# Patient Record
Sex: Female | Born: 1935 | Race: White | Hispanic: No | Smoking: Never smoker
Health system: Southern US, Community
[De-identification: ages and names within clinical notes are randomized; demographics above are authoritative.]

## PROBLEM LIST (undated history)

## (undated) DIAGNOSIS — C801 Malignant (primary) neoplasm, unspecified: Secondary | ICD-10-CM

## (undated) DIAGNOSIS — F0390 Unspecified dementia without behavioral disturbance: Secondary | ICD-10-CM

## (undated) HISTORY — PX: OTHER SURGICAL HISTORY: SHX169

## (undated) HISTORY — DX: Unspecified dementia, unspecified severity, without behavioral disturbance, psychotic disturbance, mood disturbance, and anxiety: F03.90

---

## 2020-05-31 ENCOUNTER — Emergency Department (HOSPITAL_COMMUNITY)
Admission: EM | Admit: 2020-05-31 | Discharge: 2020-06-01 | Disposition: A | Discharge status: Home / Self Care | Payer: Medicare Other | Attending: Emergency Medicine | Admitting: Emergency Medicine

## 2020-05-31 ENCOUNTER — Encounter (HOSPITAL_COMMUNITY): Payer: Self-pay

## 2020-05-31 DIAGNOSIS — S22080A Wedge compression fracture of T11-T12 vertebra, initial encounter for closed fracture: Secondary | ICD-10-CM | POA: Insufficient documentation

## 2020-05-31 DIAGNOSIS — F0390 Unspecified dementia without behavioral disturbance: Secondary | ICD-10-CM | POA: Insufficient documentation

## 2020-05-31 DIAGNOSIS — W19XXXA Unspecified fall, initial encounter: Secondary | ICD-10-CM | POA: Diagnosis not present

## 2020-05-31 DIAGNOSIS — Y939 Activity, unspecified: Secondary | ICD-10-CM | POA: Insufficient documentation

## 2020-05-31 DIAGNOSIS — Y92009 Unspecified place in unspecified non-institutional (private) residence as the place of occurrence of the external cause: Secondary | ICD-10-CM | POA: Diagnosis not present

## 2020-05-31 DIAGNOSIS — R519 Headache, unspecified: Secondary | ICD-10-CM | POA: Insufficient documentation

## 2020-05-31 DIAGNOSIS — Y999 Unspecified external cause status: Secondary | ICD-10-CM | POA: Insufficient documentation

## 2020-05-31 DIAGNOSIS — S24109A Unspecified injury at unspecified level of thoracic spinal cord, initial encounter: Secondary | ICD-10-CM | POA: Diagnosis present

## 2020-05-31 NOTE — ED Triage Notes (Addendum)
Pt BIB [facility] from home. Pt had an unwitnessed fall. A&Ox2 at baseline. Reports mid back pain initially, now reports no pain. No blood thinners and no reported LOC. Daughter is POA and is en route per EMS.

## 2020-06-01 ENCOUNTER — Emergency Department (HOSPITAL_COMMUNITY): Payer: Medicare Other

## 2020-06-01 MED ORDER — ACETAMINOPHEN 325 MG PO TABS
650.0000 mg | ORAL_TABLET | Freq: Once | ORAL | Status: AC
  Administered 2020-06-01: 650 mg via ORAL
  Filled 2020-06-01: qty 2

## 2020-06-01 NOTE — ED Notes (Signed)
[facility] arrived and took transport home.

## 2020-06-01 NOTE — ED Notes (Signed)
[facility] called for transport.  

## 2020-06-01 NOTE — ED Provider Notes (Signed)
[facility] DEPT Provider Note   CSN: [id number] Arrival date & time: 05/31/20  2223     History Chief Complaint  Patient presents with  . Fall    Denise Diaz is a 86 y.o. female.  Patient presents to the emergency department for evaluation after an unwitnessed fall.  Patient has a history of dementia, cannot provide much information.  Patient's daughter is at the bedside.  Daughter reports that the patient has Parkinson's and does have frequent falls.  She has fallen a couple of times this week and was not transported to the ED.  Patient reports that she does have some "bumps" on her head.  She is complaining of right sided flank and back pain.  Daughter reports she did have a history of right sided rib fractures, unclear if this pain is new or not.        Past Medical History:  Diagnosis Date  . Dementia ([place])     There are no problems to display for this patient.   History reviewed. No pertinent surgical history.   OB History   No obstetric history on file.     History reviewed. No pertinent family history.  Social History   Tobacco Use  . Smoking status: Not on file  Substance Use Topics  . Alcohol use: Not on file  . Drug use: Not on file    Home Medications Prior to Admission medications   Not on File    Allergies    Patient has no known allergies.  Review of Systems   Review of Systems  Musculoskeletal: Positive for back pain.  Neurological: Positive for headaches.  All other systems reviewed and are negative.   Physical Exam Updated Vital Signs BP (!) 125/104 (BP Location: Left Arm)   Pulse 63   Temp 98.3 F (36.8 C) (Oral)   Resp 14   SpO2 98%   Physical Exam Vitals and nursing note reviewed.  Constitutional:      General: She is not in acute distress.    Appearance: Normal appearance. She is well-developed.  HENT:     Head: Normocephalic. Contusion present.     Right Ear: Hearing normal.      Left Ear: Hearing normal.     Nose: Nose normal.  Eyes:     Conjunctiva/sclera: Conjunctivae normal.     Pupils: Pupils are equal, round, and reactive to light.  Cardiovascular:     Rate and Rhythm: Regular rhythm.     Heart sounds: S1 normal and S2 normal. No murmur heard.  No friction rub. No gallop.   Pulmonary:     Effort: Pulmonary effort is normal. No respiratory distress.     Breath sounds: Normal breath sounds.  Chest:     Chest wall: Tenderness (right lateral) present.  Abdominal:     General: Bowel sounds are normal.     Palpations: Abdomen is soft.     Tenderness: There is no abdominal tenderness. There is no guarding or rebound. Negative signs include Murphy's sign and McBurney's sign.     Hernia: No hernia is present.  Musculoskeletal:        General: Normal range of motion.     Cervical back: Normal range of motion and neck supple.     Lumbar back: Tenderness present.       Back:  Skin:    General: Skin is warm and dry.     Findings: No rash.  Neurological:     Mental  Status: She is alert and oriented to person, place, and time.     GCS: GCS eye subscore is 4. GCS verbal subscore is 5. GCS motor subscore is 6.     Cranial Nerves: No cranial nerve deficit.     Sensory: No sensory deficit.     Coordination: Coordination normal.  Psychiatric:        Speech: Speech normal.        Behavior: Behavior normal.        Thought Content: Thought content normal.     ED Results / Procedures / Treatments   Labs (all labs ordered are listed, but only abnormal results are displayed) Labs Reviewed - No data to display  EKG None  Radiology DG Ribs Unilateral W/Chest Right  Result Date: 06/01/2020 CLINICAL DATA:  86 year old female with fall and right chest wall pain. EXAM: RIGHT RIBS AND CHEST - 3+ VIEW COMPARISON:  Chest radiograph dated 04/23/2020 FINDINGS: . the lungs are clear. There is no pleural effusion or pneumothorax. There is mild cardiomegaly. Multiple left  lateral rib fractures as seen on the prior radiograph of 04/23/2020. No acute rib fracture identified on the right. IMPRESSION: 1. No acute cardiopulmonary process. 2. Multiple left lateral rib fractures. No acute right rib fractures. Electronically Signed   By: Anner Crete M.D.   On: 06/01/2020 01:22   DG Lumbar Spine Complete  Result Date: 06/01/2020 CLINICAL DATA:  86 year old female with fall and back pain. EXAM: LUMBAR SPINE - COMPLETE 4+ VIEW COMPARISON:  None. FINDINGS: Five lumbar type vertebra. No acute fracture of the lumbar spine. There is compression fracture of the superior endplate of Z61 with approximately 25% loss of vertebral body height, likely acute. Multilevel facet arthropathy. There is atherosclerotic calcification of the aorta. The soft tissues are grossly unremarkable. IMPRESSION: 1. No acute fracture of the lumbar spine. 2. Acute appearing compression fracture of the superior endplate of W96. Electronically Signed   By: Anner Crete M.D.   On: 06/01/2020 01:24   CT HEAD WO CONTRAST  Result Date: 06/01/2020 CLINICAL DATA:  86 year old female status post unwitnessed fall. Headache. EXAM: CT HEAD WITHOUT CONTRAST TECHNIQUE: Contiguous axial images were obtained from the base of the skull through the vertex without intravenous contrast. COMPARISON:  Brain MRI 10/16/2017.  Head CT 08/08/2017. FINDINGS: Brain: Stable cerebral volume since 2018. No midline shift, ventriculomegaly, mass effect, evidence of mass lesion, intracranial hemorrhage or evidence of cortically based acute infarction. Gray-white matter differentiation remains normal for age. No cortical encephalomalacia identified. Vascular: Mild Calcified atherosclerosis at the skull base. No suspicious intracranial vascular hyperdensity. Skull: No acute osseous abnormality identified. Sinuses/Orbits: Mild bilateral mastoid effusions since 2018. Negative visible nasopharynx. Tympanic cavities remain clear. Visualized  paranasal sinuses are stable and well pneumatized. Other: Mild posterior scalp hematoma or contusion on the left (series 4, image 37). Underlying calvarium intact. No scalp soft tissue gas. Stable and negative orbits. IMPRESSION: 1. Mild posterior scalp soft tissue injury without underlying skull fracture. 2. Stable and negative for age non contrast CT appearance of the brain. 3. Mild bilateral mastoid effusions are new since 2018, likely postinflammatory. Electronically Signed   By: Genevie Ann M.D.   On: 06/01/2020 01:03   CT CERVICAL SPINE WO CONTRAST  Result Date: 06/01/2020 CLINICAL DATA:  86 year old female status post unwitnessed fall. Headache. EXAM: CT CERVICAL SPINE WITHOUT CONTRAST TECHNIQUE: Multidetector CT imaging of the cervical spine was performed without intravenous contrast. Multiplanar CT image reconstructions were also generated. COMPARISON:  Head  CT today reported separately. FINDINGS: Alignment: Straightening of cervical lordosis with mild degenerative appearing anterolisthesis at both C4-C5 and C7-T1. Bilateral posterior element alignment is within normal limits. Skull base and vertebrae: Visualized skull base is intact. No atlanto-occipital dissociation. C1 and C2 appear intact and normally aligned. No acute osseous abnormality identified. Soft tissues and spinal canal: No prevertebral fluid or swelling. No visible canal hematoma. Calcified cervical carotid atherosclerosis but otherwise negative noncontrast neck soft tissues. Disc levels: Overall mild for age cervical spine degeneration although there is multilevel moderate to severe facet arthropathy, on the left. Upper chest: Visible upper thoracic levels appear intact. Negative lung apices. IMPRESSION: No acute traumatic injury identified in the cervical spine. Electronically Signed   By: Genevie Ann M.D.   On: 06/01/2020 01:06    Procedures Procedures (including critical care time)  Medications Ordered in ED Medications  acetaminophen  (TYLENOL) tablet 650 mg (has no administration in time range)    ED Course  I have reviewed the triage vital signs and the nursing notes.  Pertinent labs & imaging results that were available during my care of the patient were reviewed by me and considered in my medical decision making (see chart for details).    MDM Rules/Calculators/A&P                          Patient presents to the emergency department for evaluation after an unwitnessed fall at nursing home.  Patient has a history of dementia, cannot provide a lot of information about the fall or her injuries.  She has indicated that she has some pain on the right side of her back.  There is a slight contusion on the posterior scalp but she has had a couple of falls this week.  CT head and cervical spine have been performed and did not show any injury.  Right-sided rib x-ray does not show any evidence of acute fractures on the right.  She does have a slight compression fracture at T12 which is likely new.  This is likely the cause of her pain.  I do not leave she will require hospitalization, return to the nursing home with pain management.  Final Clinical Impression(s) / ED Diagnoses Final diagnoses:  T12 compression fracture, initial encounter [facility])    Rx / DC Orders ED Discharge Orders    None       Rajat Staver, Gwenyth Allegra, MD 06/01/20 [phone]

## 2020-06-09 ENCOUNTER — Telehealth: Payer: Self-pay | Admitting: Hospice

## 2020-06-10 NOTE — Telephone Encounter (Signed)
Spoke with patient's son, Ron, and discussed Palliative services with him and he was in agreement with this.  I have scheduled an In-person Consult for 06/18/20 @ 2 PM.

## 2020-06-12 ENCOUNTER — Encounter (HOSPITAL_COMMUNITY): Payer: Self-pay | Admitting: Family Medicine

## 2020-06-12 ENCOUNTER — Emergency Department (HOSPITAL_COMMUNITY): Payer: Medicare Other

## 2020-06-12 ENCOUNTER — Inpatient Hospital Stay (HOSPITAL_COMMUNITY)
Admission: EM | Admit: 2020-06-12 | Discharge: 2020-06-19 | DRG: 689 | Disposition: A | Discharge status: Skilled Nursing Facility | Payer: Medicare Other | Attending: Family Medicine | Admitting: Family Medicine

## 2020-06-12 DIAGNOSIS — I10 Essential (primary) hypertension: Secondary | ICD-10-CM | POA: Diagnosis present

## 2020-06-12 DIAGNOSIS — N3000 Acute cystitis without hematuria: Secondary | ICD-10-CM

## 2020-06-12 DIAGNOSIS — Z20822 Contact with and (suspected) exposure to covid-19: Secondary | ICD-10-CM | POA: Diagnosis present

## 2020-06-12 DIAGNOSIS — Z79899 Other long term (current) drug therapy: Secondary | ICD-10-CM | POA: Diagnosis not present

## 2020-06-12 DIAGNOSIS — E538 Deficiency of other specified B group vitamins: Secondary | ICD-10-CM | POA: Diagnosis present

## 2020-06-12 DIAGNOSIS — G92 Toxic encephalopathy: Secondary | ICD-10-CM | POA: Diagnosis present

## 2020-06-12 DIAGNOSIS — B964 Proteus (mirabilis) (morganii) as the cause of diseases classified elsewhere: Secondary | ICD-10-CM | POA: Diagnosis present

## 2020-06-12 DIAGNOSIS — F0280 Dementia in other diseases classified elsewhere without behavioral disturbance: Secondary | ICD-10-CM | POA: Diagnosis present

## 2020-06-12 DIAGNOSIS — Z66 Do not resuscitate: Secondary | ICD-10-CM | POA: Diagnosis not present

## 2020-06-12 DIAGNOSIS — M4854XA Collapsed vertebra, not elsewhere classified, thoracic region, initial encounter for fracture: Secondary | ICD-10-CM | POA: Diagnosis present

## 2020-06-12 DIAGNOSIS — Z79811 Long term (current) use of aromatase inhibitors: Secondary | ICD-10-CM | POA: Diagnosis not present

## 2020-06-12 DIAGNOSIS — G47419 Narcolepsy without cataplexy: Secondary | ICD-10-CM | POA: Diagnosis present

## 2020-06-12 DIAGNOSIS — B9620 Unspecified Escherichia coli [E. coli] as the cause of diseases classified elsewhere: Secondary | ICD-10-CM | POA: Diagnosis present

## 2020-06-12 DIAGNOSIS — R627 Adult failure to thrive: Secondary | ICD-10-CM | POA: Diagnosis present

## 2020-06-12 DIAGNOSIS — Z800 Family history of malignant neoplasm of digestive organs: Secondary | ICD-10-CM | POA: Diagnosis not present

## 2020-06-12 DIAGNOSIS — H9190 Unspecified hearing loss, unspecified ear: Secondary | ICD-10-CM | POA: Diagnosis present

## 2020-06-12 DIAGNOSIS — J45909 Unspecified asthma, uncomplicated: Secondary | ICD-10-CM | POA: Diagnosis present

## 2020-06-12 DIAGNOSIS — G20 Parkinson's disease: Secondary | ICD-10-CM | POA: Diagnosis present

## 2020-06-12 DIAGNOSIS — N390 Urinary tract infection, site not specified: Principal | ICD-10-CM | POA: Diagnosis present

## 2020-06-12 DIAGNOSIS — Z853 Personal history of malignant neoplasm of breast: Secondary | ICD-10-CM | POA: Diagnosis not present

## 2020-06-12 DIAGNOSIS — I252 Old myocardial infarction: Secondary | ICD-10-CM

## 2020-06-12 DIAGNOSIS — G3183 Dementia with Lewy bodies: Secondary | ICD-10-CM | POA: Diagnosis not present

## 2020-06-12 DIAGNOSIS — Z85820 Personal history of malignant melanoma of skin: Secondary | ICD-10-CM

## 2020-06-12 HISTORY — DX: Malignant (primary) neoplasm, unspecified: C80.1

## 2020-06-12 LAB — CBC WITH DIFFERENTIAL/PLATELET
Abs Immature Granulocytes: 0.07 10*3/uL (ref 0.00–0.07)
Basophils Absolute: 0.1 10*3/uL (ref 0.0–0.1)
Basophils Relative: 1 %
Eosinophils Absolute: 0 10*3/uL (ref 0.0–0.5)
Eosinophils Relative: 0 %
HCT: 46.6 % — ABNORMAL HIGH (ref 36.0–46.0)
Hemoglobin: 14.8 g/dL (ref 12.0–15.0)
Immature Granulocytes: 1 %
Lymphocytes Relative: 14 %
Lymphs Abs: 1.6 10*3/uL (ref 0.7–4.0)
MCH: 30 pg (ref 26.0–34.0)
MCHC: 31.8 g/dL (ref 30.0–36.0)
MCV: 94.3 fL (ref 80.0–100.0)
Monocytes Absolute: 0.8 10*3/uL (ref 0.1–1.0)
Monocytes Relative: 7 %
Neutro Abs: 8.7 10*3/uL — ABNORMAL HIGH (ref 1.7–7.7)
Neutrophils Relative %: 77 %
Platelets: 452 10*3/uL — ABNORMAL HIGH (ref 150–400)
RBC: 4.94 MIL/uL (ref 3.87–5.11)
RDW: 13.3 % (ref 11.5–15.5)
WBC: 11.3 10*3/uL — ABNORMAL HIGH (ref 4.0–10.5)
nRBC: 0 % (ref 0.0–0.2)

## 2020-06-12 LAB — URINALYSIS, ROUTINE W REFLEX MICROSCOPIC
Bilirubin Urine: NEGATIVE
Glucose, UA: NEGATIVE mg/dL
Hgb urine dipstick: NEGATIVE
Ketones, ur: 20 mg/dL — AB
Nitrite: POSITIVE — AB
Protein, ur: >=300 mg/dL — AB
Specific Gravity, Urine: 1.027 (ref 1.005–1.030)
WBC, UA: >50 WBC/hpf — ABNORMAL HIGH (ref 0–5)
pH: 9 — ABNORMAL HIGH (ref 5.0–8.0)

## 2020-06-12 LAB — BASIC METABOLIC PANEL
Anion gap: 12 (ref 5–15)
BUN: 20 mg/dL (ref 8–23)
CO2: 24 mmol/L (ref 22–32)
Calcium: 9.4 mg/dL (ref 8.9–10.3)
Chloride: 104 mmol/L (ref 98–111)
Creatinine, Ser: 0.76 mg/dL (ref 0.44–1.00)
GFR calc Af Amer: >60 mL/min (ref >60)
GFR calc non Af Amer: >60 mL/min (ref >60)
Glucose, Bld: 95 mg/dL (ref 70–99)
Potassium: 4.1 mmol/L (ref 3.5–5.1)
Sodium: 140 mmol/L (ref 135–145)

## 2020-06-12 LAB — SARS CORONAVIRUS 2 BY RT PCR (HOSPITAL ORDER, PERFORMED IN ~~LOC~~ HOSPITAL LAB): SARS Coronavirus 2: NEGATIVE

## 2020-06-12 MED ORDER — ONDANSETRON HCL 4 MG PO TABS: 4.0000 mg | ORAL_TABLET | Freq: Four times a day (QID) | ORAL | Status: DC | PRN

## 2020-06-12 MED ORDER — SODIUM CHLORIDE 0.9 % IV SOLN
1.0000 g | INTRAVENOUS | Status: DC
  Administered 2020-06-13: 1 g via INTRAVENOUS
  Filled 2020-06-12: qty 1
  Filled 2020-06-12: qty 10

## 2020-06-12 MED ORDER — ACETAMINOPHEN 325 MG PO TABS
650.0000 mg | ORAL_TABLET | Freq: Once | ORAL | Status: AC
  Administered 2020-06-12: 650 mg via ORAL
  Filled 2020-06-12: qty 2

## 2020-06-12 MED ORDER — TRAZODONE HCL 50 MG PO TABS: 25.0000 mg | ORAL_TABLET | Freq: Every evening | ORAL | Status: DC | PRN

## 2020-06-12 MED ORDER — MAGNESIUM HYDROXIDE 400 MG/5ML PO SUSP: 30.0000 mL | Freq: Every day | ORAL | Status: DC | PRN

## 2020-06-12 MED ORDER — ACETAMINOPHEN 325 MG PO TABS
650.0000 mg | ORAL_TABLET | Freq: Four times a day (QID) | ORAL | Status: DC | PRN
  Administered 2020-06-14: 650 mg via ORAL
  Filled 2020-06-12: qty 2

## 2020-06-12 MED ORDER — SODIUM CHLORIDE 0.9 % IV SOLN
1.0000 g | Freq: Once | INTRAVENOUS | Status: AC
  Administered 2020-06-12: 1 g via INTRAVENOUS
  Filled 2020-06-12: qty 10

## 2020-06-12 MED ORDER — ACETAMINOPHEN 650 MG RE SUPP: 650.0000 mg | Freq: Four times a day (QID) | RECTAL | Status: DC | PRN

## 2020-06-12 MED ORDER — SODIUM CHLORIDE 0.9 % IV SOLN: INTRAVENOUS | Status: DC

## 2020-06-12 MED ORDER — SODIUM CHLORIDE 0.9 % IV BOLUS
500.0000 mL | Freq: Once | INTRAVENOUS | Status: AC
  Administered 2020-06-12: 500 mL via INTRAVENOUS

## 2020-06-12 MED ORDER — ONDANSETRON HCL 4 MG/2ML IJ SOLN: 4.0000 mg | Freq: Four times a day (QID) | INTRAMUSCULAR | Status: DC | PRN

## 2020-06-12 MED ORDER — ENOXAPARIN SODIUM 40 MG/0.4ML ~~LOC~~ SOLN
40.0000 mg | SUBCUTANEOUS | Status: DC
  Administered 2020-06-13 – 2020-06-19 (×6): 40 mg via SUBCUTANEOUS
  Filled 2020-06-12 (×6): qty 0.4

## 2020-06-12 NOTE — H&P (Signed)
[facility] [place] NAME: Denise Diaz    MR#:  [id number]  DATE OF BIRTH:  08/12/1928  DATE OF ADMISSION:  06/12/2020  PRIMARY CARE PHYSICIAN: Rudene Anda, MD   REQUESTING/REFERRING PHYSICIAN: Virgel Manifold, MD CHIEF COMPLAINT:   Chief Complaint  Patient presents with  . Urinary Tract Infection    HISTORY OF PRESENT ILLNESS:  Denise Diaz  is a 86 y.o. Caucasian female with a known history of dementia, hypertension and reactive airway disease, presented to emergency room with acute onset of failure to thrive with progressive decline over the last week.  She has not been moving much or eating.  She has been sleeping a lot per her family.  She admitted to urinary frequency over the last 3 days and dysuria.  No oliguria or hematuria or flank pain.  No fever or chills.  No reported cough or dyspnea or wheezing or chest pain.  Upon presenting to the emergency room, blood pressure was 132/71 with otherwise normal vital signs.  Blood pressure has been elevated later on, at 135/105.  Labs revealed unremarkable CMP and CBC showed minimal leukocytosis of 11.3 with neutrophilia.  COVID-19 PCR came back negative.  UA showed more than 50 WBCs, more than 300 protein and positive nitrite.  Urine culture was sent.  Noncontrasted head CT scan revealed no acute intracranial normalities.  It did show generalized cerebral atrophy.  The patient was given 1 g of IV Rocephin and 1 g of p.o. Tylenol as well as 500 mill IV normal saline bolus.  She will be admitted to a medical bed for further evaluation and management. PAST MEDICAL HISTORY:   Past Medical History:  Diagnosis Date  . Dementia ([place])   Hypertension, reactive airway disease, old MI, benign positional vertigo, melanoma and history of breast cancer managed with chemotherapy  PAST SURGICAL HISTORY:  Melanoma excision. Breast biopsy  SOCIAL HISTORY:   Social History   Tobacco Use  . Smoking status: Not  on file  Substance Use Topics  . Alcohol use: Not on file    FAMILY HISTORY:   Positive for colon cancer in her mother hypothyroidism DRUG ALLERGIES:  No Known Allergies  REVIEW OF SYSTEMS:   ROS As per history of present illness. All pertinent systems were reviewed above. Constitutional, HEENT, cardiovascular, respiratory, GI, GU, musculoskeletal, neuro, psychiatric, endocrine, integumentary and hematologic systems were reviewed and are otherwise negative/unremarkable except for positive findings mentioned above in the HPI.   MEDICATIONS AT HOME:   Prior to Admission medications   Medication Sig Start Date End Date Taking? Authorizing Provider  anastrozole (ARIMIDEX) 1 MG tablet Take 1 mg by mouth daily. 04/06/20  Yes [provider]  Calcium-Magnesium-Vitamin D (CALCIUM 1200+D3 PO) Take 1 tablet by mouth daily.   Yes [provider]  carbidopa-levodopa (SINEMET CR) 50-200 MG tablet Take 1 tablet by mouth 2 (two) times daily.   Yes [provider]  escitalopram (LEXAPRO) 10 MG tablet Take 10 mg by mouth daily. 03/15/20  Yes [provider]  megestrol (MEGACE) 40 MG/ML suspension Take 40 mg by mouth daily.  05/15/20  Yes [provider]  meloxicam (MOBIC) 7.5 MG tablet Take 7.5 mg by mouth daily. 06/04/20  Yes [provider]  memantine (NAMENDA) 5 MG tablet Take 5 mg by mouth daily.  04/15/20  Yes [provider]  modafinil (PROVIGIL) 100 MG tablet Take 100 mg by mouth daily.  05/19/20  Yes [provider]  Multiple Vitamins-Minerals (PRESERVISION AREDS 2 PO) Take 1 tablet by mouth daily.   Yes [provider]  rivastigmine (EXELON) 4.6 mg/24hr Place 4.6 mg onto the skin daily.  06/04/20  Yes [provider]  simvastatin (ZOCOR) 40 MG tablet Take 40 mg by mouth at bedtime. 03/15/20  Yes [provider]  traMADol (ULTRAM) 50 MG tablet Take 50 mg by mouth every 6 (six) hours as needed for moderate  pain or severe pain.  06/04/20  Yes [provider]  amoxicillin (AMOXIL) 250 MG capsule Take 250 mg by mouth 3 (three) times daily. Patient not taking: Reported on 06/12/2020 06/04/20   [provider]  carbidopa-levodopa (SINEMET IR) 25-100 MG tablet Take 1 tablet by mouth 4 (four) times daily. Patient not taking: Reported on 06/12/2020 05/18/20   [provider]  cyanocobalamin (,VITAMIN B-12,) 1000 MCG/ML injection Inject into the muscle. Patient not taking: Reported on 06/12/2020 12/25/19   [provider]      VITAL SIGNS:  Blood pressure (!) 163/131, pulse 70, temperature 98.1 F (36.7 C), temperature source Oral, resp. rate 15, SpO2 100 %.  PHYSICAL EXAMINATION:  Physical Exam  GENERAL:  86 y.o.-year-old Caucasian female patient lying in the bed with no acute distress.  EYES: Pupils equal, round, reactive to light and accommodation. No scleral icterus. Extraocular muscles intact.  HEENT: Head atraumatic, normocephalic. Oropharynx and nasopharynx clear.  NECK:  Supple, no jugular venous distention. No thyroid enlargement, no tenderness.  LUNGS: Normal breath sounds bilaterally, no wheezing, rales,rhonchi or crepitation. No use of accessory muscles of respiration.  CARDIOVASCULAR: Regular rate and rhythm, S1, S2 normal. No murmurs, rubs, or gallops.  ABDOMEN: Soft, nondistended, nontender. Bowel sounds present. No organomegaly or mass.  EXTREMITIES: No pedal edema, cyanosis, or clubbing.  NEUROLOGIC: Cranial nerves II through XII are intact. Muscle strength 5/5 in all extremities. Sensation intact. Gait not checked.  PSYCHIATRIC: The patient is alert and oriented x 3.  Normal affect and good eye contact. SKIN: No obvious rash, lesion, or ulcer.   LABORATORY PANEL:   CBC Recent Labs  Lab 06/12/20 1615  WBC 11.3*  HGB 14.8  HCT 46.6*  PLT 452*    ------------------------------------------------------------------------------------------------------------------  Chemistries  Recent Labs  Lab 06/12/20 1615  NA 140  K 4.1  CL 104  CO2 24  GLUCOSE 95  BUN 20  CREATININE 0.76  CALCIUM 9.4   ------------------------------------------------------------------------------------------------------------------  Cardiac Enzymes No results for input(s): TROPONINI in the last 168 hours. ------------------------------------------------------------------------------------------------------------------  RADIOLOGY:  CT Head Wo Contrast  Result Date: 06/12/2020 CLINICAL DATA:  Altered mental status. EXAM: CT HEAD WITHOUT CONTRAST TECHNIQUE: Contiguous axial images were obtained from the base of the skull through the vertex without intravenous contrast. COMPARISON:  June 01, 2020 FINDINGS: Brain: There is moderate severity cerebral atrophy with widening of the extra-axial spaces and ventricular dilatation. There are areas of decreased attenuation within the white matter tracts of the supratentorial brain, consistent with microvascular disease changes. Vascular: No hyperdense vessel or unexpected calcification. Skull: Normal. Negative for fracture or focal lesion. Sinuses/Orbits: No acute finding. Other: None. IMPRESSION: 1. Generalized cerebral atrophy. 2. No acute intracranial abnormality. Electronically Signed   By: Virgina Norfolk M.D.   On: 06/12/2020 18:02   DG Chest Portable 1 View  Result Date: 06/12/2020 CLINICAL DATA:  Altered mental status EXAM: PORTABLE CHEST 1 VIEW COMPARISON:  Radiographs 06/02/2019 FINDINGS: No consolidation, features of edema, pneumothorax, or effusion. The aorta is calcified. The remaining cardiomediastinal contours  are unremarkable. No acute osseous or soft tissue abnormality. Stable appearance of several progressive healing left rib fractures. Degenerative changes are present in the imaged spine and shoulders.  Telemetry leads overlie the chest. IMPRESSION: No acute cardiopulmonary abnormality. Continued healing of the left rib fractures. Aortic Atherosclerosis (ICD10-I70.0). Electronically Signed   By: Lovena Le M.D.   On: 06/12/2020 16:09      IMPRESSION AND PLAN:   1.  Failure to thrive, likely secondary to UTI. -The patient will be admitted to a medical bed. -Continue IV antibiotic therapy with Rocephin. -We will follow urine culture and sensitivity. -Physical therapy consult to be obtained. -The patient will be hydrated with IV normal saline.  2.  Recent T12 vertebral compression fracture. -Pain management will be provided as well as physical therapy as mentioned above.  3.  History of breast cancer. -She will be continued on Arimidex.  4.  Dementia with parkinsonism. -Exelon patch and Sinemet CR will be continued.  5.  Vitamin B12 deficiency. -Vitamin B12 will be continued.  6.  Narcolepsy. -Modafinil will be continued.  7.  DVT prophylaxis. -Subcutaneous Lovenox.  All the records are reviewed and case discussed with ED provider. The plan of care was discussed in details with the patient (and family). I answered all questions. The patient agreed to proceed with the above mentioned plan. Further management will depend upon hospital course.   CODE STATUS: Full code  Status is: Inpatient  Remains inpatient appropriate because:Ongoing active pain requiring inpatient pain management, Ongoing diagnostic testing needed not appropriate for outpatient work up, Unsafe d/c plan, IV treatments appropriate due to intensity of illness or inability to take PO and Inpatient level of care appropriate due to severity of illness   Dispo: The patient is from: Home              Anticipated d/c is to: Home              Anticipated d/c date is: 2 days              Patient currently is not medically stable to d/c.   TOTAL TIME TAKING CARE OF THIS PATIENT: 55 minutes.    Christel Mormon M.D  on 06/12/2020 at 9:03 PM  [facility]   From 7 PM-7 AM, contact night-coverage [email or web address]  CC: Primary care physician; Rudene Anda, MD   Note: This dictation was prepared with Dragon dictation along with smaller phrase technology. Any transcriptional typo errors that result from this process are unintentional.

## 2020-06-12 NOTE — ED Triage Notes (Signed)
[facility] transported pt from home to [facility] ED and reports the following:  Family noticed pt cognition declining for 3 days and noticed foul smell in urine, so they called EMS. Baseline she is oriented to herself. Currently, she isn't talking and is reactive to pain. Pt is potentially a fall risk.

## 2020-06-12 NOTE — ED Provider Notes (Signed)
[facility] DEPT Provider Note   CSN: [id number] Arrival date & time: 06/12/20  1500     History Chief Complaint  Patient presents with  . Urinary Tract Infection    Denise Diaz is a 86 y.o. female.  HPI   Patient is a 86 year old female with a history of dementia.  She was brought in by EMS today at the request of her family.  Per nursing note, family has noticed that her cognition has been declining for the last 3 days and noticed foul-smelling urine.  At baseline she is oriented to self.  Today she appears to be oriented to self but not to place or time.  Patient is not clearly answering questions.  I spoke to the patient's son, Ron.  Patient states that she has a history of Parkinson's and dementia at baseline.  He states that on a normal day she will ambulate and will sit and stand up unassisted but recently has been experiencing more falls.  Several months ago she fell and broke multiple ribs.  Earlier this month she was evaluated after another fall for which she was dx with a T12 compression fracture.  He states that since then, she has been in significant pain.  Earlier this week he states that she "went into a complete collapse". She continues to complain of pain and has been ambulating less and becoming more altered intermittently. One of her caregivers noted that her urine has been darker and "foul smelling" and is concerned she might have a UTI. They additionally noted that she had an episode of hematuria 4 days ago that they noticed in her diaper but has not experienced an episode since. No hematochezia.   Level five caveat due to dementia     Past Medical History:  Diagnosis Date  . Dementia ([place])     There are no problems to display for this patient.   No past surgical history on file.   OB History   No obstetric history on file.     No family history on file.  Social History   Tobacco Use  . Smoking status: Not on file    Substance Use Topics  . Alcohol use: Not on file  . Drug use: Not on file    Home Medications Prior to Admission medications   Not on File    Allergies    Patient has no known allergies.  Review of Systems   Review of Systems  Unable to perform ROS: Dementia   Physical Exam Updated Vital Signs BP (!) 152/71   Pulse 71   Temp 98.1 F (36.7 C) (Oral)   SpO2 100%   Physical Exam Vitals and nursing note reviewed.  Constitutional:      General: She is not in acute distress.    Appearance: Normal appearance. She is normal weight. She is not ill-appearing, toxic-appearing or diaphoretic.     Comments: Elderly fatigued appearing female lying supine and asleep. She will state her name but otherwise is not providing clear answers to questions.   HENT:     Head: Normocephalic and atraumatic.     Right Ear: External ear normal.     Left Ear: External ear normal.     Nose: Nose normal.     Mouth/Throat:     Mouth: Mucous membranes are dry.     Pharynx: Oropharynx is clear. No oropharyngeal exudate or posterior oropharyngeal erythema.  Eyes:     General: No scleral icterus.  Right eye: No discharge.        Left eye: No discharge.     Extraocular Movements: Extraocular movements intact.     Conjunctiva/sclera: Conjunctivae normal.  Cardiovascular:     Rate and Rhythm: Normal rate and regular rhythm.     Pulses: Normal pulses.     Heart sounds: Normal heart sounds. No murmur heard.  No friction rub. No gallop.   Pulmonary:     Effort: Pulmonary effort is normal. No respiratory distress.     Breath sounds: Normal breath sounds. No stridor. No wheezing, rhonchi or rales.  Abdominal:     General: Abdomen is flat.     Palpations: Abdomen is soft.     Comments: Abdomen is flat and soft.  Patient did not appear to be in pain with palpation of the abdomen.  Musculoskeletal:        General: Normal range of motion.     Cervical back: Normal range of motion and neck supple.   Skin:    General: Skin is warm and dry.  Neurological:     Comments: History of dementia.  Oriented to self.    ED Results / Procedures / Treatments   Labs (all labs ordered are listed, but only abnormal results are displayed) Labs Reviewed  URINALYSIS, ROUTINE W REFLEX MICROSCOPIC - Abnormal; Notable for the following components:      Result Value   Color, Urine AMBER (*)    APPearance CLOUDY (*)    pH 9.0 (*)    Ketones, ur 20 (*)    Protein, ur >=300 (*)    Nitrite POSITIVE (*)    Leukocytes,Ua LARGE (*)    WBC, UA >50 (*)    Bacteria, UA RARE (*)    All other components within normal limits  CBC WITH DIFFERENTIAL/PLATELET - Abnormal; Notable for the following components:   WBC 11.3 (*)    HCT 46.6 (*)    Platelets 452 (*)    Neutro Abs 8.7 (*)    All other components within normal limits  URINE CULTURE  SARS CORONAVIRUS 2 BY RT PCR (HOSPITAL ORDER,  LAB)  BASIC METABOLIC PANEL   EKG None  Radiology CT Head Wo Contrast  Result Date: 06/12/2020 CLINICAL DATA:  Altered mental status. EXAM: CT HEAD WITHOUT CONTRAST TECHNIQUE: Contiguous axial images were obtained from the base of the skull through the vertex without intravenous contrast. COMPARISON:  June 01, 2020 FINDINGS: Brain: There is moderate severity cerebral atrophy with widening of the extra-axial spaces and ventricular dilatation. There are areas of decreased attenuation within the white matter tracts of the supratentorial brain, consistent with microvascular disease changes. Vascular: No hyperdense vessel or unexpected calcification. Skull: Normal. Negative for fracture or focal lesion. Sinuses/Orbits: No acute finding. Other: None. IMPRESSION: 1. Generalized cerebral atrophy. 2. No acute intracranial abnormality. Electronically Signed   By: Virgina Norfolk M.D.   On: 06/12/2020 18:02   DG Chest Portable 1 View  Result Date: 06/12/2020 CLINICAL DATA:  Altered mental status  EXAM: PORTABLE CHEST 1 VIEW COMPARISON:  Radiographs 06/02/2019 FINDINGS: No consolidation, features of edema, pneumothorax, or effusion. The aorta is calcified. The remaining cardiomediastinal contours are unremarkable. No acute osseous or soft tissue abnormality. Stable appearance of several progressive healing left rib fractures. Degenerative changes are present in the imaged spine and shoulders. Telemetry leads overlie the chest. IMPRESSION: No acute cardiopulmonary abnormality. Continued healing of the left rib fractures. Aortic Atherosclerosis (ICD10-I70.0). Electronically Signed  By: Lovena Le M.D.   On: 06/12/2020 16:09    Procedures Procedures   Medications Ordered in ED Medications  acetaminophen (TYLENOL) tablet 650 mg (650 mg Oral Given 06/12/20 1844)   ED Course  I have reviewed the triage vital signs and the nursing notes.  Pertinent labs & imaging results that were available during my care of the patient were reviewed by me and considered in my medical decision making (see chart for details).  Clinical Course as of Jun 12 2048  Fri Jun 12, 2020  1527 Afebrile  Temp: 98.1 F (36.7 C) [LJ]  1527 Not tachycardic  Pulse Rate: 71 [LJ]  1618 No acute cardiopulmonary findings.  Continued healing of rib fractures.  DG Chest Portable 1 View [LJ]  1656 WBC(!): 11.3 [LJ]  1656 Platelets(!): 452 [LJ]  1657 NEUT#(!): 8.7 [LJ]  1809 Generalized cerebral atrophy with no acute intracranial abnormality.  CT Head Wo Contrast [LJ]  1810 Nitrite(!): POSITIVE [LJ]  1924 WBC, UA(!): >50 [LJ]  1924 Leukocytes,Ua(!): LARGE [LJ]  1926 Protein(!): >=300 [LJ]  1926 Ketones, ur(!): 20 [LJ]    Clinical Course User Index [LJ] Rayna Sexton, PA-C   MDM Rules/Calculators/A&P                          Pt is a 86 y.o. female that present with a history, physical exam, ED Clinical Course as noted above.   Patient has a history of dementia and was brought to the emergency department due to  worsening altered mental status, lethargy, an episode of hematuria and foul-smelling urine.  She was found to have UTI at today's visit.  Due to her age, medical history, symptoms, will discuss with hospitalist team for possible admission.  Patient was given a gram of IV Rocephin for symptoms and started on maintenance fluids.  I discussed next steps with her daughter.  She is amenable with this plan.  COVID-19 test ordered.  Note: Portions of this report may have been transcribed using voice recognition software. Every effort was made to ensure accuracy; however, inadvertent computerized transcription errors may be present.   Final Clinical Impression(s) / ED Diagnoses Final diagnoses:  Acute cystitis without hematuria  Failure to thrive in adult   Rx / DC Orders ED Discharge Orders    None       Rayna Sexton, Hershal Coria 06/12/20 2051    Virgel Manifold, MD 06/13/20 2248

## 2020-06-12 NOTE — ED Provider Notes (Signed)
EKG:  Rhythm: normal sinus Rate: 68 Axis: normal PR: 154 ms QRS: 94 ms ST segments: NS ST changes    Virgel Manifold, MD 06/12/20 1649

## 2020-06-12 NOTE — ED Notes (Signed)
Floor not ready, to callback monetarily.

## 2020-06-13 ENCOUNTER — Other Ambulatory Visit: Payer: Self-pay

## 2020-06-13 DIAGNOSIS — N3000 Acute cystitis without hematuria: Secondary | ICD-10-CM

## 2020-06-13 LAB — BASIC METABOLIC PANEL
Anion gap: 8 (ref 5–15)
BUN: 17 mg/dL (ref 8–23)
CO2: 24 mmol/L (ref 22–32)
Calcium: 8.6 mg/dL — ABNORMAL LOW (ref 8.9–10.3)
Chloride: 107 mmol/L (ref 98–111)
Creatinine, Ser: 0.67 mg/dL (ref 0.44–1.00)
GFR calc Af Amer: >60 mL/min (ref >60)
GFR calc non Af Amer: >60 mL/min (ref >60)
Glucose, Bld: 86 mg/dL (ref 70–99)
Potassium: 3.9 mmol/L (ref 3.5–5.1)
Sodium: 139 mmol/L (ref 135–145)

## 2020-06-13 LAB — CBC
HCT: 39.1 % (ref 36.0–46.0)
Hemoglobin: 12.2 g/dL (ref 12.0–15.0)
MCH: 29.9 pg (ref 26.0–34.0)
MCHC: 31.2 g/dL (ref 30.0–36.0)
MCV: 95.8 fL (ref 80.0–100.0)
Platelets: 402 10*3/uL — ABNORMAL HIGH (ref 150–400)
RBC: 4.08 MIL/uL (ref 3.87–5.11)
RDW: 13.3 % (ref 11.5–15.5)
WBC: 8.6 10*3/uL (ref 4.0–10.5)
nRBC: 0 % (ref 0.0–0.2)

## 2020-06-13 LAB — GLUCOSE, CAPILLARY: Glucose-Capillary: 77 mg/dL (ref 70–99)

## 2020-06-13 MED ORDER — SODIUM CHLORIDE 0.9 % IV SOLN: INTRAVENOUS | Status: AC

## 2020-06-13 NOTE — Progress Notes (Signed)
PROGRESS NOTE    Denise Diaz  OXB:[id number] DOB: June 30, 1928 DOA: 06/12/2020 PCP: Rudene Anda, MD  Brief Narrative: [name] is a 86 year old female with history of dementia, Parkinson's disease, hypertension, reactive airway disorder presented to the emergency room with ongoing failure to thrive, progressive decline over the last few weeks, in addition to ongoing cognitive and functional decline over several months. -Work-up in the emergency room noted normal vital signs, mild leukocytosis, UA with greater than 50 WBCs, positive nitrite, CT head noted advanced atrophy, chest x-ray was unremarkable, other electrolytes were within normal limits. -She was admitted last night and started on IV fluids along with ceftriaxone   Assessment & Plan:   Sepsis secondary to UTI Toxic encephalopathy -Continue IV ceftriaxone and normal saline today -Monitor for urinary retention -Follow-up blood and urine cultures -PT OT eval and mental status improves  Adult failure to thrive -The setting of above infections and underlying dementia with Parkinson's disease -Monitor with treatment of above -May need palliative care evaluation if she does not improve  Recent T12 vertebral compression fracture -Supportive care, PT in 1 to 2 days as tolerated  Dementia Parkinson's disease -Continue home regimen of Sinemet and Exelon patch  History of breast cancer -Continue Arimidex  History of narcolepsy -Modafinil continued  DVT prophylaxis: Lovenox Code Status: Full code, called daughter and recommended consideration of DNR Family Communication: No family at bedside, called and updated daughter Denise Diaz Disposition Plan:  Status is: Inpatient  Remains inpatient appropriate because:Inpatient level of care appropriate due to severity of illness   Dispo: The patient is from: Home              Anticipated d/c is to: SNF              Anticipated d/c date is: 3 days              Patient currently  is not medically stable to d/c.  Antimicrobials:    Subjective: -Drowsy and somnolent most of the morning, mumbles few words and answer simple questions with 1-2 word answers  Objective: Vitals:   06/13/20 0016 06/13/20 0524 06/13/20 0947 06/13/20 1317  BP: (!) 168/99 (!) 185/83 (!) 151/82 (!) 154/82  Pulse: 73 77 75 78  Resp: 20 18 20 20   Temp: 98 F (36.7 C) 98.8 F (37.1 C) 98.3 F (36.8 C) 98 F (36.7 C)  TempSrc: Oral  Axillary Axillary  SpO2: 100% 100% 99% 99%  Height: 5\' 6"  (1.676 m)       Intake/Output Summary (Last 24 hours) at 06/13/2020 1429 Last data filed at 06/13/2020 1301 Gross per 24 hour  Intake 1775 ml  Output 825 ml  Net 950 ml   There were no vitals filed for this visit.  Examination:  General exam: Chronically ill elderly female, laying in bed, somnolent but arousable, confused, unable to assess orientation HEENT : No icterus  CVS: S1-S2, regular rhythm Lungs: Clear anteriorly  Abdomen: Soft, nontender, bowel sounds present, no bladder fullness noted Extremities: No edema  Skin: No rashes on exposed skin Psychiatry: poor Judgement and insight    Data Reviewed:   CBC: Recent Labs  Lab 06/12/20 1615 06/13/20 0342  WBC 11.3* 8.6  NEUTROABS 8.7*  --   HGB 14.8 12.2  HCT 46.6* 39.1  MCV 94.3 95.8  PLT 452* 683*   Basic Metabolic Panel: Recent Labs  Lab 06/12/20 1615 06/13/20 0342  NA 140 139  K 4.1 3.9  CL 104 107  CO2  24 24  GLUCOSE 95 86  BUN 20 17  CREATININE 0.76 0.67  CALCIUM 9.4 8.6*   GFR: CrCl cannot be calculated (Unknown ideal weight.). Liver Function Tests: No results for input(s): AST, ALT, ALKPHOS, BILITOT, PROT, ALBUMIN in the last 168 hours. No results for input(s): LIPASE, AMYLASE in the last 168 hours. No results for input(s): AMMONIA in the last 168 hours. Coagulation Profile: No results for input(s): INR, PROTIME in the last 168 hours. Cardiac Enzymes: No results for input(s): CKTOTAL, CKMB, CKMBINDEX,  TROPONINI in the last 168 hours. BNP (last 3 results) No results for input(s): PROBNP in the last 8760 hours. HbA1C: No results for input(s): HGBA1C in the last 72 hours. CBG: Recent Labs  Lab 06/13/20 1025  GLUCAP 77   Lipid Profile: No results for input(s): CHOL, HDL, LDLCALC, TRIG, CHOLHDL, LDLDIRECT in the last 72 hours. Thyroid Function Tests: No results for input(s): TSH, T4TOTAL, FREET4, T3FREE, THYROIDAB in the last 72 hours. Anemia Panel: No results for input(s): VITAMINB12, FOLATE, FERRITIN, TIBC, IRON, RETICCTPCT in the last 72 hours. Urine analysis:    Component Value Date/Time   COLORURINE AMBER (A) 06/12/2020 1801   APPEARANCEUR CLOUDY (A) 06/12/2020 1801   LABSPEC 1.027 06/12/2020 1801   PHURINE 9.0 (H) 06/12/2020 1801   GLUCOSEU NEGATIVE 06/12/2020 1801   HGBUR NEGATIVE 06/12/2020 1801   BILIRUBINUR NEGATIVE 06/12/2020 1801   KETONESUR 20 (A) 06/12/2020 1801   PROTEINUR >=300 (A) 06/12/2020 1801   NITRITE POSITIVE (A) 06/12/2020 1801   LEUKOCYTESUR LARGE (A) 06/12/2020 1801   Sepsis Labs: @LABRCNTIP (procalcitonin:4,lacticidven:4)  ) Recent Results (from the past 240 hour(s))  SARS Coronavirus 2 by RT PCR (hospital order, performed in [place] hospital lab) Nasopharyngeal Nasopharyngeal Swab     Status: None   Collection Time: 06/12/20  8:47 PM   Specimen: Nasopharyngeal Swab  Result Value Ref Range Status   SARS Coronavirus 2 NEGATIVE NEGATIVE Final    Comment: (NOTE) SARS-CoV-2 target nucleic acids are NOT DETECTED.  The SARS-CoV-2 RNA is generally detectable in upper and lower respiratory specimens during the acute phase of infection. The lowest concentration of SARS-CoV-2 viral copies this assay can detect is 250 copies / mL. A negative result does not preclude SARS-CoV-2 infection and should not be used as the sole basis for treatment or other patient management decisions.  A negative result may occur with improper specimen collection /  handling, submission of specimen other than nasopharyngeal swab, presence of viral mutation(s) within the areas targeted by this assay, and inadequate number of viral copies (<250 copies / mL). A negative result must be combined with clinical observations, patient history, and epidemiological information.  Fact Sheet for Patients:   StrictlyIdeas.no  Fact Sheet for Healthcare Providers: BankingDealers.co.za  This test is not yet approved or  cleared by the [place] FDA and has been authorized for detection and/or diagnosis of SARS-CoV-2 by FDA under an Emergency Use Authorization (EUA).  This EUA will remain in effect (meaning this test can be used) for the duration of the COVID-19 declaration under Section 564(b)(1) of the Act, 21 U.S.C. section 360bbb-3(b)(1), unless the authorization is terminated or revoked sooner.  Performed at [facility], [place] [address] [place], [place] [address]          Radiology Studies: CT Head Wo Contrast  Result Date: 06/12/2020 CLINICAL DATA:  Altered mental status. EXAM: CT HEAD WITHOUT CONTRAST TECHNIQUE: Contiguous axial images were obtained from the base of the skull through the  vertex without intravenous contrast. COMPARISON:  June 01, 2020 FINDINGS: Brain: There is moderate severity cerebral atrophy with widening of the extra-axial spaces and ventricular dilatation. There are areas of decreased attenuation within the white matter tracts of the supratentorial brain, consistent with microvascular disease changes. Vascular: No hyperdense vessel or unexpected calcification. Skull: Normal. Negative for fracture or focal lesion. Sinuses/Orbits: No acute finding. Other: None. IMPRESSION: 1. Generalized cerebral atrophy. 2. No acute intracranial abnormality. Electronically Signed   By: Virgina Norfolk M.D.   On: 06/12/2020 18:02   DG Chest Portable 1 View  Result Date:  06/12/2020 CLINICAL DATA:  Altered mental status EXAM: PORTABLE CHEST 1 VIEW COMPARISON:  Radiographs 06/02/2019 FINDINGS: No consolidation, features of edema, pneumothorax, or effusion. The aorta is calcified. The remaining cardiomediastinal contours are unremarkable. No acute osseous or soft tissue abnormality. Stable appearance of several progressive healing left rib fractures. Degenerative changes are present in the imaged spine and shoulders. Telemetry leads overlie the chest. IMPRESSION: No acute cardiopulmonary abnormality. Continued healing of the left rib fractures. Aortic Atherosclerosis (ICD10-I70.0). Electronically Signed   By: Lovena Le M.D.   On: 06/12/2020 16:09        Scheduled Meds: . enoxaparin (LOVENOX) injection  40 mg Subcutaneous Q24H   Continuous Infusions: . sodium chloride 50 mL/hr at 06/13/20 1120  . cefTRIAXone (ROCEPHIN)  IV       LOS: 1 day    Time spent: 10min  Domenic Polite, MD [facility]  06/13/2020, 2:29 PM

## 2020-06-13 NOTE — Plan of Care (Signed)

## 2020-06-14 LAB — URINE CULTURE: Culture: >=100000 — AB

## 2020-06-14 LAB — BASIC METABOLIC PANEL
Anion gap: 8 (ref 5–15)
BUN: 16 mg/dL (ref 8–23)
CO2: 24 mmol/L (ref 22–32)
Calcium: 8.7 mg/dL — ABNORMAL LOW (ref 8.9–10.3)
Chloride: 108 mmol/L (ref 98–111)
Creatinine, Ser: 0.78 mg/dL (ref 0.44–1.00)
GFR calc Af Amer: >60 mL/min (ref >60)
GFR calc non Af Amer: >60 mL/min (ref >60)
Glucose, Bld: 78 mg/dL (ref 70–99)
Potassium: 3.8 mmol/L (ref 3.5–5.1)
Sodium: 140 mmol/L (ref 135–145)

## 2020-06-14 LAB — CBC
HCT: 42.5 % (ref 36.0–46.0)
Hemoglobin: 13.1 g/dL (ref 12.0–15.0)
MCH: 29.8 pg (ref 26.0–34.0)
MCHC: 30.8 g/dL (ref 30.0–36.0)
MCV: 96.8 fL (ref 80.0–100.0)
Platelets: 419 10*3/uL — ABNORMAL HIGH (ref 150–400)
RBC: 4.39 MIL/uL (ref 3.87–5.11)
RDW: 13.3 % (ref 11.5–15.5)
WBC: 7.7 10*3/uL (ref 4.0–10.5)
nRBC: 0 % (ref 0.0–0.2)

## 2020-06-14 LAB — AMMONIA: Ammonia: 12 umol/L (ref 9–35)

## 2020-06-14 LAB — TSH: TSH: 2.377 u[IU]/mL (ref 0.350–4.500)

## 2020-06-14 MED ORDER — SODIUM CHLORIDE 0.9 % IV SOLN: INTRAVENOUS | Status: AC

## 2020-06-14 MED ORDER — ESCITALOPRAM OXALATE 10 MG PO TABS
10.0000 mg | ORAL_TABLET | Freq: Every day | ORAL | Status: DC
  Administered 2020-06-15 – 2020-06-19 (×4): 10 mg via ORAL
  Filled 2020-06-14 (×5): qty 1

## 2020-06-14 MED ORDER — RIVASTIGMINE 4.6 MG/24HR TD PT24
4.6000 mg | MEDICATED_PATCH | Freq: Every day | TRANSDERMAL | Status: DC
  Administered 2020-06-14 – 2020-06-19 (×6): 4.6 mg via TRANSDERMAL
  Filled 2020-06-14 (×6): qty 1

## 2020-06-14 MED ORDER — MEMANTINE HCL 10 MG PO TABS
10.0000 mg | ORAL_TABLET | Freq: Every day | ORAL | Status: DC
  Administered 2020-06-14 – 2020-06-19 (×5): 10 mg via ORAL
  Filled 2020-06-14 (×5): qty 1

## 2020-06-14 MED ORDER — MODAFINIL 200 MG PO TABS
100.0000 mg | ORAL_TABLET | Freq: Every day | ORAL | Status: DC
  Administered 2020-06-14 – 2020-06-19 (×5): 100 mg via ORAL
  Filled 2020-06-14 (×5): qty 1

## 2020-06-14 MED ORDER — CEFAZOLIN SODIUM-DEXTROSE 1-4 GM/50ML-% IV SOLN
1.0000 g | Freq: Three times a day (TID) | INTRAVENOUS | Status: AC
  Administered 2020-06-14 – 2020-06-19 (×15): 1 g via INTRAVENOUS
  Filled 2020-06-14 (×15): qty 50

## 2020-06-14 MED ORDER — CARBIDOPA-LEVODOPA ER 50-200 MG PO TBCR
1.0000 | EXTENDED_RELEASE_TABLET | Freq: Two times a day (BID) | ORAL | Status: DC
  Administered 2020-06-14 – 2020-06-19 (×11): 1 via ORAL
  Filled 2020-06-14 (×13): qty 1

## 2020-06-14 NOTE — Progress Notes (Signed)
PROGRESS NOTE    Denise Diaz  QQI:[id number] DOB: [birth year] DOA: 06/12/2020 PCP: Rudene Anda, MD  Brief Narrative: [name] is a 86 year old female with history of dementia, Parkinson's disease, hypertension, reactive airway disorder presented to the emergency room with ongoing failure to thrive, progressive decline over the last few weeks, in addition to ongoing cognitive and functional decline over several months. -Work-up in the emergency room noted normal vital signs, mild leukocytosis, UA with greater than 50 WBCs, positive nitrite, CT head noted advanced atrophy, chest x-ray was unremarkable, other electrolytes were within normal limits. -She was admitted last night and started on IV fluids along with ceftriaxone   Assessment & Plan:   Sepsis secondary to UTI Toxic encephalopathy -Mild improvement telemetry thus far, also check TSH and ammonia level -Continue IV ceftriaxone, urine culture with Proteus mirabilis -Monitor for urinary retention -Blood cultures are negative -PT OT eval tomorrow as mentation  Adult failure to thrive -The setting of above infections and underlying dementia with Parkinson's disease -Monitor with treatment of above, resume home meds including modafinil -May need palliative care evaluation if she does not improve  Recent T12 vertebral compression fracture -Supportive care, PT in 1 to 2 days as tolerated  Dementia Parkinson's disease -Resume Sinemet and Exelon patch  History of breast cancer -Restart Arimidex  History of narcolepsy -Restart modafinil   DVT prophylaxis: Lovenox Code Status: DNR Family Communication: No family at bedside, called and updated daughter Arrie Aran 7/24 Disposition Plan:  Status is: Inpatient  Remains inpatient appropriate because:Inpatient level of care appropriate due to severity of illness  Dispo: The patient is from: Home              Anticipated d/c is to: SNF              Anticipated d/c date is: 2-3  days              Patient currently is not medically stable to d/c.  Antimicrobials:    Subjective: -More interactive this morning, answers some of my questions but still sleeps most of the time, nursing staff is able to feed her breakfast this morning  Objective: Vitals:   06/13/20 1317 06/13/20 2113 06/14/20 0619 06/14/20 1358  BP: (!) 154/82 (!) 174/85 (!) 165/91 (!) 158/85  Pulse: 78 73 67 72  Resp: 20 14 16 18   Temp: 98 F (36.7 C) 98.3 F (36.8 C) 98.6 F (37 C) 98.3 F (36.8 C)  TempSrc: Axillary Axillary Axillary Oral  SpO2: 99% 100% 99% 99%  Height:        Intake/Output Summary (Last 24 hours) at 06/14/2020 1401 Last data filed at 06/14/2020 1017 Gross per 24 hour  Intake 921.56 ml  Output 450 ml  Net 471.56 ml   There were no vitals filed for this visit.  Examination:  General exam: Chronically ill elderly female laying in bed, somnolent but arousable, answers few questions, unable to assess orientation HEENT: CVS: S1-S2, regular rate rhythm Lungs: Clear anteriorly Abdomen: Soft, nontender, bowel sounds present Extremities: No edema  Skin: No rashes on exposed skin Psychiatry: Unable to assess   Data Reviewed:   CBC: Recent Labs  Lab 06/12/20 1615 06/13/20 0342 06/14/20 0335  WBC 11.3* 8.6 7.7  NEUTROABS 8.7*  --   --   HGB 14.8 12.2 13.1  HCT 46.6* 39.1 42.5  MCV 94.3 95.8 96.8  PLT 452* 402* 941*   Basic Metabolic Panel: Recent Labs  Lab 06/12/20 1615 06/13/20 0342 06/14/20 0335  NA 140 139 140  K 4.1 3.9 3.8  CL 104 107 108  CO2 24 24 24   GLUCOSE 95 86 78  BUN 20 17 16   CREATININE 0.76 0.67 0.78  CALCIUM 9.4 8.6* 8.7*   GFR: CrCl cannot be calculated (Unknown ideal weight.). Liver Function Tests: No results for input(s): AST, ALT, ALKPHOS, BILITOT, PROT, ALBUMIN in the last 168 hours. No results for input(s): LIPASE, AMYLASE in the last 168 hours. Recent Labs  Lab 06/14/20 1205  AMMONIA 12   Coagulation Profile: No  results for input(s): INR, PROTIME in the last 168 hours. Cardiac Enzymes: No results for input(s): CKTOTAL, CKMB, CKMBINDEX, TROPONINI in the last 168 hours. BNP (last 3 results) No results for input(s): PROBNP in the last 8760 hours. HbA1C: No results for input(s): HGBA1C in the last 72 hours. CBG: Recent Labs  Lab 06/13/20 1025  GLUCAP 77   Lipid Profile: No results for input(s): CHOL, HDL, LDLCALC, TRIG, CHOLHDL, LDLDIRECT in the last 72 hours. Thyroid Function Tests: Recent Labs    06/14/20 1205  TSH 2.377   Anemia Panel: No results for input(s): VITAMINB12, FOLATE, FERRITIN, TIBC, IRON, RETICCTPCT in the last 72 hours. Urine analysis:    Component Value Date/Time   COLORURINE AMBER (A) 06/12/2020 1801   APPEARANCEUR CLOUDY (A) 06/12/2020 1801   LABSPEC 1.027 06/12/2020 1801   PHURINE 9.0 (H) 06/12/2020 1801   GLUCOSEU NEGATIVE 06/12/2020 1801   HGBUR NEGATIVE 06/12/2020 1801   BILIRUBINUR NEGATIVE 06/12/2020 1801   KETONESUR 20 (A) 06/12/2020 1801   PROTEINUR >=300 (A) 06/12/2020 1801   NITRITE POSITIVE (A) 06/12/2020 1801   LEUKOCYTESUR LARGE (A) 06/12/2020 1801   Sepsis Labs: @LABRCNTIP (procalcitonin:4,lacticidven:4)  ) Recent Results (from the past 240 hour(s))  Urine culture     Status: Abnormal   Collection Time: 06/12/20  6:01 PM   Specimen: Urine, Catheterized  Result Value Ref Range Status   Specimen Description   Final    URINE, CATHETERIZED Performed at [facility], [place] [address] [place], [place] [address]    Special Requests   Final    NONE Performed at [facility], [place] [address] [place], [place] [address]    Culture >=100,000 COLONIES/mL PROTEUS MIRABILIS (A)  Final   Report Status 06/14/2020 FINAL  Final   Organism ID, Bacteria PROTEUS MIRABILIS (A)  Final      Susceptibility   Proteus mirabilis - MIC*    AMPICILLIN >=32 RESISTANT Resistant     CEFAZOLIN 8 SENSITIVE Sensitive     CEFTRIAXONE  <=0.25 SENSITIVE Sensitive     CIPROFLOXACIN <=0.25 SENSITIVE Sensitive     GENTAMICIN >=16 RESISTANT Resistant     IMIPENEM 2 SENSITIVE Sensitive     NITROFURANTOIN 128 RESISTANT Resistant     TRIMETH/SULFA <=20 SENSITIVE Sensitive     AMPICILLIN/SULBACTAM 16 INTERMEDIATE Intermediate     PIP/TAZO <=4 SENSITIVE Sensitive     * >=100,000 COLONIES/mL PROTEUS MIRABILIS  SARS Coronavirus 2 by RT PCR (hospital order, performed in [place] hospital lab) Nasopharyngeal Nasopharyngeal Swab     Status: None   Collection Time: 06/12/20  8:47 PM   Specimen: Nasopharyngeal Swab  Result Value Ref Range Status   SARS Coronavirus 2 NEGATIVE NEGATIVE Final    Comment: (NOTE) SARS-CoV-2 target nucleic acids are NOT DETECTED.  The SARS-CoV-2 RNA is generally detectable in upper and lower respiratory specimens during the acute phase of infection. The lowest concentration of SARS-CoV-2 viral copies this assay can detect  is 250 copies / mL. A negative result does not preclude SARS-CoV-2 infection and should not be used as the sole basis for treatment or other patient management decisions.  A negative result may occur with improper specimen collection / handling, submission of specimen other than nasopharyngeal swab, presence of viral mutation(s) within the areas targeted by this assay, and inadequate number of viral copies (<250 copies / mL). A negative result must be combined with clinical observations, patient history, and epidemiological information.  Fact Sheet for Patients:   StrictlyIdeas.no  Fact Sheet for Healthcare Providers: BankingDealers.co.za  This test is not yet approved or  cleared by the [place] FDA and has been authorized for detection and/or diagnosis of SARS-CoV-2 by FDA under an Emergency Use Authorization (EUA).  This EUA will remain in effect (meaning this test can be used) for the duration of the COVID-19 declaration  under Section 564(b)(1) of the Act, 21 U.S.C. section 360bbb-3(b)(1), unless the authorization is terminated or revoked sooner.  Performed at [facility], [place] [address] [place], [place] [address]          Radiology Studies: CT Head Wo Contrast  Result Date: 06/12/2020 CLINICAL DATA:  Altered mental status. EXAM: CT HEAD WITHOUT CONTRAST TECHNIQUE: Contiguous axial images were obtained from the base of the skull through the vertex without intravenous contrast. COMPARISON:  June 01, 2020 FINDINGS: Brain: There is moderate severity cerebral atrophy with widening of the extra-axial spaces and ventricular dilatation. There are areas of decreased attenuation within the white matter tracts of the supratentorial brain, consistent with microvascular disease changes. Vascular: No hyperdense vessel or unexpected calcification. Skull: Normal. Negative for fracture or focal lesion. Sinuses/Orbits: No acute finding. Other: None. IMPRESSION: 1. Generalized cerebral atrophy. 2. No acute intracranial abnormality. Electronically Signed   By: Virgina Norfolk M.D.   On: 06/12/2020 18:02   DG Chest Portable 1 View  Result Date: 06/12/2020 CLINICAL DATA:  Altered mental status EXAM: PORTABLE CHEST 1 VIEW COMPARISON:  Radiographs 06/02/2019 FINDINGS: No consolidation, features of edema, pneumothorax, or effusion. The aorta is calcified. The remaining cardiomediastinal contours are unremarkable. No acute osseous or soft tissue abnormality. Stable appearance of several progressive healing left rib fractures. Degenerative changes are present in the imaged spine and shoulders. Telemetry leads overlie the chest. IMPRESSION: No acute cardiopulmonary abnormality. Continued healing of the left rib fractures. Aortic Atherosclerosis (ICD10-I70.0). Electronically Signed   By: Lovena Le M.D.   On: 06/12/2020 16:09        Scheduled Meds: . carbidopa-levodopa  1 tablet Oral BID  . enoxaparin  (LOVENOX) injection  40 mg Subcutaneous Q24H  . [START ON 06/15/2020] escitalopram  10 mg Oral Daily  . memantine  10 mg Oral Daily  . modafinil  100 mg Oral Daily  . rivastigmine  4.6 mg Transdermal Daily   Continuous Infusions: . sodium chloride    . cefTRIAXone (ROCEPHIN)  IV 1 g (06/13/20 1957)     LOS: 2 days    Time spent: 54min  Domenic Polite, MD [facility]  06/14/2020, 2:01 PM

## 2020-06-15 LAB — BASIC METABOLIC PANEL
Anion gap: 9 (ref 5–15)
BUN: 16 mg/dL (ref 8–23)
CO2: 25 mmol/L (ref 22–32)
Calcium: 8.7 mg/dL — ABNORMAL LOW (ref 8.9–10.3)
Chloride: 106 mmol/L (ref 98–111)
Creatinine, Ser: 0.68 mg/dL (ref 0.44–1.00)
GFR calc Af Amer: >60 mL/min (ref >60)
GFR calc non Af Amer: >60 mL/min (ref >60)
Glucose, Bld: 100 mg/dL — ABNORMAL HIGH (ref 70–99)
Potassium: 3.5 mmol/L (ref 3.5–5.1)
Sodium: 140 mmol/L (ref 135–145)

## 2020-06-15 LAB — CBC
HCT: 39.6 % (ref 36.0–46.0)
Hemoglobin: 12.6 g/dL (ref 12.0–15.0)
MCH: 29.9 pg (ref 26.0–34.0)
MCHC: 31.8 g/dL (ref 30.0–36.0)
MCV: 93.8 fL (ref 80.0–100.0)
Platelets: 409 10*3/uL — ABNORMAL HIGH (ref 150–400)
RBC: 4.22 MIL/uL (ref 3.87–5.11)
RDW: 13.2 % (ref 11.5–15.5)
WBC: 9.5 10*3/uL (ref 4.0–10.5)
nRBC: 0 % (ref 0.0–0.2)

## 2020-06-15 MED ORDER — LABETALOL HCL 5 MG/ML IV SOLN
20.0000 mg | INTRAVENOUS | Status: DC | PRN
  Administered 2020-06-15 (×2): 20 mg via INTRAVENOUS
  Filled 2020-06-15 (×2): qty 4

## 2020-06-15 MED ORDER — ENSURE ENLIVE PO LIQD
237.0000 mL | Freq: Two times a day (BID) | ORAL | Status: DC
  Administered 2020-06-16 – 2020-06-19 (×6): 237 mL via ORAL

## 2020-06-15 MED ORDER — LABETALOL HCL 5 MG/ML IV SOLN: 20.0000 mg | INTRAVENOUS | Status: DC | PRN

## 2020-06-15 NOTE — Plan of Care (Signed)
  Problem: Coping: Goal: Level of anxiety will decrease Outcome: Progressing   Problem: Pain Managment: Goal: General experience of comfort will improve Outcome: Progressing   

## 2020-06-15 NOTE — Progress Notes (Signed)
Patient has history of HTN, has had elevated BPs, no PRNs ordered. Contacted MD on call for PRN order for medication to treat elevated BPs. Verbal order given, see MAR. Medication administered. Dawson Bills, RN

## 2020-06-15 NOTE — Evaluation (Signed)
Clinical/Bedside Swallow Evaluation Patient Details  Name: Denise Diaz MRN: [id number] Date of Birth: 06/14/1928  Today's Date: 06/15/2020 Time: SLP Start Time (ACUTE ONLY): 4098 SLP Stop Time (ACUTE ONLY): 1605 SLP Time Calculation (min) (ACUTE ONLY): 32 min  Past Medical History:  Past Medical History:  Diagnosis Date  . Cancer ([place])    hx breast cancer treated with meds  . Dementia [facility])    Past Surgical History:  Past Surgical History:  Procedure Laterality Date  . melanoma removal     HPI:  86 yo female adm to [facility] with AMS, found to have UTI, FTT.  Pt PMH + for dementia, Parkinson's, FTT and UTIs.  Swallow eval ordered.  Pt's daughter present and reports pt consumes softer foods at home *x fresh fruit* due to her dentition issue.   Pt with poor intake and was placed on an appetitie stimulant *? megace? approx 1.5 years ago.  Her daughter reports she likes sweets.  No oral pocketing with food reported at home but pt does sometimes pocket her pills.  CXR negative.   Assessment / Plan / Recommendation Clinical Impression  pt  Limited assessment due to pt's decreased participation and frustration with being in hospital and being repositioned.  Pt without focal CN deficits from tasks she completed.  She does tend to lean to the right - daughter states she leans opposite way at home in her chair.  Observed pt consuming Ensure and water bolus x1 each only.  She declined to consume more intake and SLP honored her wishes to prevent agitation. Her daughter states pt does not pocket foods at home, drinks one Ensure daily  She takes her medications with grapes - puts pill in mouth and then grapes and masticate and swallows?  Recommend soft diet as consumed premorbidly.  Educated daughter, granddaughter and pt to need for pt to self feed as able and to gustatory changes to sweet taste intact with progression of cognitive decline.  Recommend dys3/thin diet, medicine with grapes or applesauce.    No SlP follow up indicated as daughter has a good understanding of pt's swallowing ability/limitations.  Thanks for this consult. SLP Visit Diagnosis: Dysphagia, oral phase (R13.11)    Aspiration Risk  Mild aspiration risk    Diet Recommendation Dysphagia 3 (Mech soft);Thin liquid   Liquid Administration via: Straw;Cup Medication Administration: Whole meds with puree (with grapes of puree) Supervision: Full supervision/cueing for compensatory strategies;Comment (family ok to feed pt) Compensations: Slow rate;Small sips/bites Postural Changes: Seated upright at 90 degrees;Remain upright for at least 30 minutes after po intake    Other  Recommendations Oral Care Recommendations: Oral care BID   Follow up Recommendations None      Frequency and Duration     n/a       Prognosis    n/a    Swallow Study   General Date of Onset: 06/15/20 HPI: 86 yo female adm to [facility] with AMS, found to have UTI, FTT.  Pt PMH + for dementia, Parkinson's, FTT and UTIs.  Swallow eval ordered.  Pt's daughter present and reports pt consumes softer foods at home *x fresh fruit* due to her dentition issue.   Pt with poor intake and was placed on an appetitie stimulant *? megace? approx 1.5 years ago.  Her daughter reports she likes sweets.  No oral pocketing with food reported at home but pt does sometimes pocket her pills.  CXR negative. Type of Study: Bedside Swallow Evaluation Previous Swallow Assessment: none in  system Diet Prior to this Study: Regular;Thin liquids Temperature Spikes Noted: No Respiratory Status: Room air History of Recent Intubation: No Behavior/Cognition: Alert;Doesn't follow directions;Distractible (pt inconsistently follows directions, per daughter Arrie Aran - pt wants to go home) Oral Care Completed by SLP: No Oral Cavity - Dentition: Adequate natural dentition (daughter reports pt has a few teeth that need removed and this may impact her intake) Vision: Functional for  self-feeding Self-Feeding Abilities:  (held her own cup, declined consuming any solids) Patient Positioning: Upright in bed (pt required repositioning twice) Baseline Vocal Quality: Normal Volitional Cough: Cognitively unable to elicit Volitional Swallow: Unable to elicit    Oral/Motor/Sensory Function     Ice Chips Ice chips: Not tested   Thin Liquid Thin Liquid: Within functional limits Presentation: Self Fed;Straw    Nectar Thick Nectar Thick Liquid: Within functional limits Presentation: Self Fed;Straw   Honey Thick Honey Thick Liquid: Not tested   Puree Puree: Not tested Other Comments: pt declined to consume any more than a bolus of water and Ensure   Solid     Solid: Not tested Other Comments: pt declined to consume any more than a bolus of water and Ensure      Denise Diaz 06/15/2020,4:59 PM  Kathleen Lime, MS [place] Office [phone]

## 2020-06-15 NOTE — Progress Notes (Addendum)
PROGRESS NOTE    Denise Diaz  YWV:[id number] DOB: 1927/11/23 DOA: 06/12/2020 PCP: Rudene Anda, MD  Brief Narrative: [name] is a 86 year old female with history of dementia, Parkinson's disease, hypertension, reactive airway disorder presented to the emergency room with ongoing failure to thrive, progressive decline over the last few weeks, in addition to ongoing cognitive and functional decline over several months. -Work-up in the emergency room noted normal vital signs, mild leukocytosis, UA with greater than 50 WBCs, positive nitrite, CT head noted advanced atrophy, chest x-ray was unremarkable, other electrolytes were within normal limits. -She was admitted and started on IV fluids along with ceftriaxone   Assessment & Plan:   Sepsis secondary to UTI Toxic encephalopathy -Slow improvement in mentation, more awake today, able to sit up and interact more -Urine culture with E. coli, ceftriaxone changed to IV Ancef, will keep on IV antibiotics given unreliable p.o. intake -Monitor for urinary retention, urine cultures negative -Ammonia level and TSH were unremarkable -Out of bed to chair -PT OT eval -Resumed modafinil  Adult failure to thrive -The setting of above infections and underlying dementia with Parkinson's disease -Monitor with treatment of above, resume home meds including modafinil -Would benefit from palliative care evaluation possibly outpatient  Recent T12 vertebral compression fracture -Supportive care, PT in 1 to 2 days as tolerated  Dementia Parkinson's disease -Resume Sinemet and Exelon patch  History of breast cancer -Continue Arimidex  History of narcolepsy -Continue modafinil   DVT prophylaxis: Lovenox Code Status: DNR Family Communication: No family at bedside, called and updated daughter Arrie Aran 7/24 Disposition Plan:  Status is: Inpatient  Remains inpatient appropriate because:Inpatient level of care appropriate due to severity of  illness  Dispo: The patient is from: Home              Anticipated d/c is to: SNF              Anticipated d/c date is: Possibly 48 hours              Patient currently is not medically stable to d/c.  Antimicrobials:    Subjective: -More interactive today, opens eyes, able to answer some of my questions, -Attempting to drink water and eat pancakes  Objective: Vitals:   06/14/20 2121 06/15/20 0502 06/15/20 0756 06/15/20 1159  BP: (!) 186/83 (!) 164/97 (!) 172/96 (!) 143/83  Pulse: 72 65 67 62  Resp: 16 16    Temp: 98.6 F (37 C) 98.6 F (37 C)    TempSrc: Axillary Axillary    SpO2: 100% 100%    Height:        Intake/Output Summary (Last 24 hours) at 06/15/2020 1209 Last data filed at 06/15/2020 1001 Gross per 24 hour  Intake 310 ml  Output 900 ml  Net -590 ml   There were no vitals filed for this visit.  Examination:  General exam: Chronically ill elderly female, laying in bed, more lucid and interactive today, oriented to self only, answer simple questions and follows commands  HEENT: No JVD CVS: S1-S2, regular rhythm  Lungs: Clear anteriorly Abdomen: Soft, nontender, bowel sounds present  Extremities: No edema  Skin: No rashes on exposed skin Psychiatry: Unable to assess   Data Reviewed:   CBC: Recent Labs  Lab 06/12/20 1615 06/13/20 0342 06/14/20 0335 06/15/20 0422  WBC 11.3* 8.6 7.7 9.5  NEUTROABS 8.7*  --   --   --   HGB 14.8 12.2 13.1 12.6  HCT 46.6* 39.1 42.5 39.6  MCV 94.3 95.8 96.8 93.8  PLT 452* 402* 419* 326*   Basic Metabolic Panel: Recent Labs  Lab 06/12/20 1615 06/13/20 0342 06/14/20 0335 06/15/20 0422  NA 140 139 140 140  K 4.1 3.9 3.8 3.5  CL 104 107 108 106  CO2 24 24 24 25   GLUCOSE 95 86 78 100*  BUN 20 17 16 16   CREATININE 0.76 0.67 0.78 0.68  CALCIUM 9.4 8.6* 8.7* 8.7*   GFR: CrCl cannot be calculated (Unknown ideal weight.). Liver Function Tests: No results for input(s): AST, ALT, ALKPHOS, BILITOT, PROT, ALBUMIN in  the last 168 hours. No results for input(s): LIPASE, AMYLASE in the last 168 hours. Recent Labs  Lab 06/14/20 1205  AMMONIA 12   Coagulation Profile: No results for input(s): INR, PROTIME in the last 168 hours. Cardiac Enzymes: No results for input(s): CKTOTAL, CKMB, CKMBINDEX, TROPONINI in the last 168 hours. BNP (last 3 results) No results for input(s): PROBNP in the last 8760 hours. HbA1C: No results for input(s): HGBA1C in the last 72 hours. CBG: Recent Labs  Lab 06/13/20 1025  GLUCAP 77   Lipid Profile: No results for input(s): CHOL, HDL, LDLCALC, TRIG, CHOLHDL, LDLDIRECT in the last 72 hours. Thyroid Function Tests: Recent Labs    06/14/20 1205  TSH 2.377   Anemia Panel: No results for input(s): VITAMINB12, FOLATE, FERRITIN, TIBC, IRON, RETICCTPCT in the last 72 hours. Urine analysis:    Component Value Date/Time   COLORURINE AMBER (A) 06/12/2020 1801   APPEARANCEUR CLOUDY (A) 06/12/2020 1801   LABSPEC 1.027 06/12/2020 1801   PHURINE 9.0 (H) 06/12/2020 1801   GLUCOSEU NEGATIVE 06/12/2020 1801   HGBUR NEGATIVE 06/12/2020 1801   BILIRUBINUR NEGATIVE 06/12/2020 1801   KETONESUR 20 (A) 06/12/2020 1801   PROTEINUR >=300 (A) 06/12/2020 1801   NITRITE POSITIVE (A) 06/12/2020 1801   LEUKOCYTESUR LARGE (A) 06/12/2020 1801   Sepsis Labs: @LABRCNTIP (procalcitonin:4,lacticidven:4)  ) Recent Results (from the past 240 hour(s))  Urine culture     Status: Abnormal   Collection Time: 06/12/20  6:01 PM   Specimen: Urine, Catheterized  Result Value Ref Range Status   Specimen Description   Final    URINE, CATHETERIZED Performed at [facility], [place] [address] [place], [place] [address]    Special Requests   Final    NONE Performed at [facility], [place] [address], [place], [place] [address]    Culture >=100,000 COLONIES/mL PROTEUS MIRABILIS (A)  Final   Report Status 06/14/2020 FINAL  Final   Organism ID, Bacteria PROTEUS  MIRABILIS (A)  Final      Susceptibility   Proteus mirabilis - MIC*    AMPICILLIN >=32 RESISTANT Resistant     CEFAZOLIN 8 SENSITIVE Sensitive     CEFTRIAXONE <=0.25 SENSITIVE Sensitive     CIPROFLOXACIN <=0.25 SENSITIVE Sensitive     GENTAMICIN >=16 RESISTANT Resistant     IMIPENEM 2 SENSITIVE Sensitive     NITROFURANTOIN 128 RESISTANT Resistant     TRIMETH/SULFA <=20 SENSITIVE Sensitive     AMPICILLIN/SULBACTAM 16 INTERMEDIATE Intermediate     PIP/TAZO <=4 SENSITIVE Sensitive     * >=100,000 COLONIES/mL PROTEUS MIRABILIS  SARS Coronavirus 2 by RT PCR (hospital order, performed in [place] hospital lab) Nasopharyngeal Nasopharyngeal Swab     Status: None   Collection Time: 06/12/20  8:47 PM   Specimen: Nasopharyngeal Swab  Result Value Ref Range Status   SARS Coronavirus 2 NEGATIVE NEGATIVE Final    Comment: (NOTE)  SARS-CoV-2 target nucleic acids are NOT DETECTED.  The SARS-CoV-2 RNA is generally detectable in upper and lower respiratory specimens during the acute phase of infection. The lowest concentration of SARS-CoV-2 viral copies this assay can detect is 250 copies / mL. A negative result does not preclude SARS-CoV-2 infection and should not be used as the sole basis for treatment or other patient management decisions.  A negative result may occur with improper specimen collection / handling, submission of specimen other than nasopharyngeal swab, presence of viral mutation(s) within the areas targeted by this assay, and inadequate number of viral copies (<250 copies / mL). A negative result must be combined with clinical observations, patient history, and epidemiological information.  Fact Sheet for Patients:   StrictlyIdeas.no  Fact Sheet for Healthcare Providers: BankingDealers.co.za  This test is not yet approved or  cleared by the [place] FDA and has been authorized for detection and/or diagnosis of SARS-CoV-2  by FDA under an Emergency Use Authorization (EUA).  This EUA will remain in effect (meaning this test can be used) for the duration of the COVID-19 declaration under Section 564(b)(1) of the Act, 21 U.S.C. section 360bbb-3(b)(1), unless the authorization is terminated or revoked sooner.  Performed at [facility], [place] [address], [place], [place] [address]          Radiology Studies: No results found.      Scheduled Meds: . carbidopa-levodopa  1 tablet Oral BID  . enoxaparin (LOVENOX) injection  40 mg Subcutaneous Q24H  . escitalopram  10 mg Oral Daily  . memantine  10 mg Oral Daily  . modafinil  100 mg Oral Daily  . rivastigmine  4.6 mg Transdermal Daily   Continuous Infusions: .  ceFAZolin (ANCEF) IV 1 g (06/15/20 1203)     LOS: 3 days    Time spent: 54min  Domenic Polite, MD [facility]  06/15/2020, 12:09 PM

## 2020-06-16 ENCOUNTER — Inpatient Hospital Stay (HOSPITAL_COMMUNITY): Payer: Medicare Other

## 2020-06-16 MED ORDER — LORAZEPAM 2 MG/ML IJ SOLN
1.0000 mg | Freq: Every day | INTRAMUSCULAR | Status: AC | PRN
  Administered 2020-06-16: 1 mg via INTRAVENOUS
  Filled 2020-06-16: qty 1

## 2020-06-16 NOTE — [place] FL2 (Addendum)
[place] MEDICAID FL2 LEVEL OF CARE SCREENING TOOL     IDENTIFICATION  Patient Name: Denise Diaz Birthdate: [birth year] Sex: female Admission Date (Current Location): 06/12/2020  [facility] and [place] Number:  Herbalist and Address:  [facility],  [place], [place]      Provider Number: [id number]  Attending Physician Name and Address:  Domenic Polite, MD  Relative Name and Phone Number:  Gwendlyn, [place] Daughter   [phone]    Current Level of Care: Hospital Recommended Level of Care: [place] Prior Approval Number:    Date Approved/Denied:   PASRR Number:   [id number]  Discharge Plan: SNF    Current Diagnoses: Patient Active Problem List   Diagnosis Date Noted   UTI (urinary tract infection) 06/12/2020    Orientation RESPIRATION BLADDER Height & Weight     Self  Normal Incontinent Weight:   Height:  5\' 6"  (167.6 cm)  BEHAVIORAL SYMPTOMS/MOOD NEUROLOGICAL BOWEL NUTRITION STATUS      Incontinent Diet (Dys. 3 diet)  AMBULATORY STATUS COMMUNICATION OF NEEDS Skin   Extensive Assist Verbally Normal                       Personal Care Assistance Level of Assistance  Bathing, Feeding, Dressing Bathing Assistance: Maximum assistance Feeding assistance: Limited assistance Dressing Assistance: Maximum assistance     Functional Limitations Info  Sight, Hearing, Speech Sight Info: Impaired Hearing Info: Impaired Speech Info: Adequate    SPECIAL CARE FACTORS FREQUENCY  PT (By licensed PT), OT (By licensed OT)     PT Frequency: 5x/week OT Frequency: 5x/week            Contractures Contractures Info: Not present    Additional Factors Info  Code Status, Allergies, Psychotropic Code Status Info: DNR Allergies Info: Allergies: No Known Allergies Psychotropic Info: Lexapro, Namenda         Current Medications (06/16/2020):  This is the current hospital active medication list Current  Facility-Administered Medications  Medication Dose Route Frequency Provider Last Rate Last Admin   acetaminophen (TYLENOL) tablet 650 mg  650 mg Oral Q6H PRN Mansy, Jan A, MD   650 mg at 06/14/20 1025   Or   acetaminophen (TYLENOL) suppository 650 mg  650 mg Rectal Q6H PRN Mansy, Jan A, MD       carbidopa-levodopa (SINEMET CR) 50-200 MG per tablet controlled release 1 tablet  1 tablet Oral BID Domenic Polite, MD   1 tablet at 06/16/20 1051   ceFAZolin (ANCEF) IVPB 1 g/50 mL premix  1 g Intravenous Q8H Domenic Polite, MD 100 mL/hr at 06/16/20 1216 1 g at 06/16/20 1216   enoxaparin (LOVENOX) injection 40 mg  40 mg Subcutaneous Q24H Mansy, Jan A, MD   40 mg at 06/16/20 1013   escitalopram (LEXAPRO) tablet 10 mg  10 mg Oral Daily Domenic Polite, MD   10 mg at 06/16/20 1050   feeding supplement (ENSURE ENLIVE) (ENSURE ENLIVE) liquid 237 mL  237 mL Oral BID BM Domenic Polite, MD   237 mL at 06/16/20 1404   labetalol (NORMODYNE) injection 20 mg  20 mg Intravenous Q3H PRN Domenic Polite, MD   20 mg at 06/15/20 1024   magnesium hydroxide (MILK OF MAGNESIA) suspension 30 mL  30 mL Oral Daily PRN Mansy, Jan A, MD       memantine [facility]) tablet 10 mg  10 mg Oral Daily Domenic Polite, MD   10 mg at 06/16/20  1051   modafinil (PROVIGIL) tablet 100 mg  100 mg Oral Daily Domenic Polite, MD   100 mg at 06/16/20 1050   ondansetron (ZOFRAN) tablet 4 mg  4 mg Oral Q6H PRN Mansy, Jan A, MD       Or   ondansetron [facility]) injection 4 mg  4 mg Intravenous Q6H PRN Mansy, Jan A, MD       rivastigmine (EXELON) 4.6 mg/24hr 4.6 mg  4.6 mg Transdermal Daily Domenic Polite, MD   4.6 mg at 06/16/20 1014     Discharge Medications: Please see discharge summary for a list of discharge medications.  Relevant Imaging Results:  Relevant Lab Results:   Additional Information SSN: [id number]  Lia Hopping, LCSW

## 2020-06-16 NOTE — Progress Notes (Addendum)
PROGRESS NOTE    Denise Diaz  HWE:[id number] DOB: 06-13-1928 DOA: 06/12/2020 PCP: Rudene Anda, MD  Brief Narrative: Denise Diaz is a 86 year old female with history of dementia, Parkinson's disease, hypertension, reactive airway disorder presented to [facility]ise emergency room with ongoing failure to thrive, progressive decline over Denise last 2 weeks, in addition to ongoing cognitive and functional decline over several months. -Work-up in Denise emergency room noted normal vital signs, mild leukocytosis, UA with greater than 50 WBCs, positive nitrite, CT head noted advanced atrophy, chest x-ray was unremarkable, other electrolytes were within normal limits. -She was admitted and started on IV fluids along with ceftriaxone -mentation slowly improving and less lethargic as days went by -7/27 noon noted to have garbled, nonsensical speech, worse than previously   Assessment & Plan:   Sepsis secondary to UTI Toxic encephalopathy -Mental status has shown slow improvement, less lethargic at this time  -Treated with IV ceftriaxone, urine cultures grew E. coli  -Now on Ancef  -Today noted to have more garbled, nonsensical speech , I am concerned for motor aphasia , will obtain MRI brain -ammonia level and TSH were unremarkable earlier, CT head w/ atrophy and non acute on admission -Up in Denise chair, PT OT today  -Continue home meds including modafinil   Adult failure to thrive -Denise setting of above infections and underlying dementia with Parkinson's disease -Monitor with treatment of above, resume home meds including modafinil -check MRI brain  As above  Recent T12 vertebral compression fracture -Supportive care, PT eval  Dementia Parkinson's disease -Resume Sinemet and Exelon patch  History of breast cancer -Continue Arimidex  History of narcolepsy -Continue modafinil   DVT prophylaxis: Lovenox Code Status: DNR Family Communication:  updated daughter 7/27 Disposition Plan:  Status is:  Inpatient  Remains inpatient appropriate because:Inpatient level of care appropriate due to severity of illness  Dispo: Denise Diaz is from: Home              Anticipated d/c is to: SNF              Anticipated d/c date is: Possibly 48 hours              Diaz currently is not medically stable to d/c.  Antimicrobials:    Subjective: -Sleeping this morning, when I really assessed her in Denise afternoon, she was sitting up in bed eating lunch, however speech was garbled and nonsensical, not following commands as well as yesterday and mild facial droop noted as well  Objective: Vitals:   06/15/20 1159 06/15/20 1426 06/15/20 2014 06/16/20 0618  BP: (!) 143/83 (!) 157/83 (!) 113/61 (!) 163/87  Pulse: 62 66 76 63  Resp:  18 18 16   Temp:  98.2 F (36.8 C) 99 F (37.2 C) (!) 100.5 F (38.1 C)  TempSrc:  Oral Axillary Oral  SpO2:  98% 100% 100%  Height:        Intake/Output Summary (Last 24 hours) at 06/16/2020 1349 Last data filed at 06/16/2020 0900 Gross per 24 hour  Intake 180 ml  Output 500 ml  Net -320 ml   There were no vitals filed for this visit.  Examination:  General exam: Chronically ill elderly female, sitting up in bed, awake, alert oriented to self only, dysarthria noted with garbled speech  HEENT: Mild facial droop CVS: S1-S2, regular rate rhythm Lungs: Clear bilaterally Abdomen: Soft, nontender, bowel sounds present Extremities: No edema Neuro : Awake alert, oriented to self only, occasionally follows simple commands, would  not lift her legs up passively, upper extremity exam is unremarkable  DTR 1+, plantars are mute  Extremities: No edema  Skin: No rashes on exposed skin Psychiatry: Unable to assess   Data Reviewed:   CBC: Recent Labs  Lab 06/12/20 1615 06/13/20 0342 06/14/20 0335 06/15/20 0422  WBC 11.3* 8.6 7.7 9.5  NEUTROABS 8.7*  --   --   --   HGB 14.8 12.2 13.1 12.6  HCT 46.6* 39.1 42.5 39.6  MCV 94.3 95.8 96.8 93.8  PLT 452* 402* 419*  295*   Basic Metabolic Panel: Recent Labs  Lab 06/12/20 1615 06/13/20 0342 06/14/20 0335 06/15/20 0422  NA 140 139 140 140  K 4.1 3.9 3.8 3.5  CL 104 107 108 106  CO2 24 24 24 25   GLUCOSE 95 86 78 100*  BUN 20 17 16 16   CREATININE 0.76 0.67 0.78 0.68  CALCIUM 9.4 8.6* 8.7* 8.7*   GFR: CrCl cannot be calculated (Unknown ideal weight.). Liver Function Tests: No results for input(s): AST, ALT, ALKPHOS, BILITOT, PROT, ALBUMIN in Denise last 168 hours. No results for input(s): LIPASE, AMYLASE in Denise last 168 hours. Recent Labs  Lab 06/14/20 1205  AMMONIA 12   Coagulation Profile: No results for input(s): INR, PROTIME in Denise last 168 hours. Cardiac Enzymes: No results for input(s): CKTOTAL, CKMB, CKMBINDEX, TROPONINI in Denise last 168 hours. BNP (last 3 results) No results for input(s): PROBNP in Denise last 8760 hours. HbA1C: No results for input(s): HGBA1C in Denise last 72 hours. CBG: Recent Labs  Lab 06/13/20 1025  GLUCAP 77   Lipid Profile: No results for input(s): CHOL, HDL, LDLCALC, TRIG, CHOLHDL, LDLDIRECT in Denise last 72 hours. Thyroid Function Tests: Recent Labs    06/14/20 1205  TSH 2.377   Anemia Panel: No results for input(s): VITAMINB12, FOLATE, FERRITIN, TIBC, IRON, RETICCTPCT in Denise last 72 hours. Urine analysis:    Component Value Date/Time   COLORURINE AMBER (A) 06/12/2020 1801   APPEARANCEUR CLOUDY (A) 06/12/2020 1801   LABSPEC 1.027 06/12/2020 1801   PHURINE 9.0 (H) 06/12/2020 1801   GLUCOSEU NEGATIVE 06/12/2020 1801   HGBUR NEGATIVE 06/12/2020 1801   BILIRUBINUR NEGATIVE 06/12/2020 1801   KETONESUR 20 (A) 06/12/2020 1801   PROTEINUR >=300 (A) 06/12/2020 1801   NITRITE POSITIVE (A) 06/12/2020 1801   LEUKOCYTESUR LARGE (A) 06/12/2020 1801   Sepsis Labs: @LABRCNTIP (procalcitonin:4,lacticidven:4)  ) Recent Results (from Denise past 240 hour(s))  Urine culture     Status: Abnormal   Collection Time: 06/12/20  6:01 PM   Specimen: Urine, Catheterized   Result Value Ref Range Status   Specimen Description   Final    URINE, CATHETERIZED Performed at [facility], [place] [address], [place], [place] [address]    Special Requests   Final    NONE Performed at [facility], [place] [address] [place], [place] [address]    Culture >=100,000 COLONIES/mL PROTEUS MIRABILIS (A)  Final   Report Status 06/14/2020 FINAL  Final   Organism ID, Bacteria PROTEUS MIRABILIS (A)  Final      Susceptibility   Proteus mirabilis - MIC*    AMPICILLIN >=32 RESISTANT Resistant     CEFAZOLIN 8 SENSITIVE Sensitive     CEFTRIAXONE <=0.25 SENSITIVE Sensitive     CIPROFLOXACIN <=0.25 SENSITIVE Sensitive     GENTAMICIN >=16 RESISTANT Resistant     IMIPENEM 2 SENSITIVE Sensitive     NITROFURANTOIN 128 RESISTANT Resistant     TRIMETH/SULFA <=20 SENSITIVE  Sensitive     AMPICILLIN/SULBACTAM 16 INTERMEDIATE Intermediate     PIP/TAZO <=4 SENSITIVE Sensitive     * >=100,000 COLONIES/mL PROTEUS MIRABILIS  SARS Coronavirus 2 by RT PCR (hospital order, performed in [facility] hospital lab) Nasopharyngeal Nasopharyngeal Swab     Status: None   Collection Time: 06/12/20  8:47 PM   Specimen: Nasopharyngeal Swab  Result Value Ref Range Status   SARS Coronavirus 2 NEGATIVE NEGATIVE Final    Comment: (NOTE) SARS-CoV-2 target nucleic acids are NOT DETECTED.  Denise SARS-CoV-2 RNA is generally detectable in upper and lower respiratory specimens during Denise acute phase of infection. Denise lowest concentration of SARS-CoV-2 viral copies this assay can detect is 250 copies / mL. A negative result does not preclude SARS-CoV-2 infection and should not be used as Denise sole basis for treatment or other Diaz management decisions.  A negative result may occur with improper specimen collection / handling, submission of specimen other than nasopharyngeal swab, presence of viral mutation(s) within Denise areas targeted by this assay, and inadequate number of  viral copies (<250 copies / mL). A negative result must be combined with clinical observations, Diaz history, and epidemiological information.  Fact Sheet for Patients:   StrictlyIdeas.no  Fact Sheet for Healthcare Providers: BankingDealers.co.za  This test is not yet approved or  cleared by [place] FDA and has been authorized for detection and/or diagnosis of SARS-CoV-2 by FDA under an Emergency Use Authorization (EUA).  This EUA will remain in effect (meaning this test can be used) for Denise duration of Denise COVID-19 declaration under Section 564(b)(1) of Denise Act, 21 U.S.C. section 360bbb-3(b)(1), unless Denise authorization is terminated or revoked sooner.  Performed at [facility], [place] [address] [place],  [address]          Radiology Studies: No results found.      Scheduled Meds: . carbidopa-levodopa  1 tablet Oral BID  . enoxaparin (LOVENOX) injection  40 mg Subcutaneous Q24H  . escitalopram  10 mg Oral Daily  . feeding supplement (ENSURE ENLIVE)  237 mL Oral BID BM  . memantine  10 mg Oral Daily  . modafinil  100 mg Oral Daily  . rivastigmine  4.6 mg Transdermal Daily   Continuous Infusions: .  ceFAZolin (ANCEF) IV 1 g (06/16/20 1216)     LOS: 4 days    Time spent: 50min  Domenic Polite, MD [facility]  06/16/2020, 1:49 PM

## 2020-06-16 NOTE — TOC Initial Note (Signed)
Transition of Care [facility]) - Initial/Assessment Note    Patient Details  Name: Denise Diaz MRN: [id number] Date of Birth: [birth year]  Transition of [facility]) CM/SW Contact:    Lia Hopping, [place] Phone Number: 06/16/2020, 1:01 PM  Clinical Narrative:  Patient has medical hx dementia, hypertension and reactive airway disease, pt. presented with failure to thrive with progressive decline over the last week. Patient admitted UTI.               CSW reached out to the patient daughter to discuss the discharge plan. The patient lives in the home with her son.  Per daughter, at home the patient is ambulatory with a walker to the bathroom. The patient needs assistance with bathing, dressing and feeding. She prefers the patient return home but understands the patient will need therapy before returning home. She gave csw permission to make a referral to the local SNF's.   FL2 complete.  PT/OT notes pending.  Patient will [facility] insurance approval.    Expected Discharge Plan: Skilled Nursing Facility Barriers to Discharge: Continued Medical Work up, Ship broker   Patient Goals and CMS Choice   CMS Medicare.gov Compare Post Acute Care list provided to:: Other (Comment Required) (Daughter) Choice offered to / list presented to : Adult Children  Expected Discharge Plan and Services Expected Discharge Plan: [place]   Discharge Planning Services: NA Post Acute Care Choice: [place] Living arrangements for the past 2 months: Single Family Home                 DME Arranged: N/A DME Agency: NA       HH Arranged: NA HH Agency: NA        Prior Living Arrangements/Services Living arrangements for the past 2 months: [place] Lives with:: Adult Children Patient language and need for interpreter reviewed:: No Do you feel safe going back to the place where you live?: Yes      Need for Family Participation in Patient Care: Yes  (Comment) Care giver support system in place?: Yes (comment) Current home services: DME Criminal Activity/Legal Involvement Pertinent to Current Situation/Hospitalization: No - Comment as needed  Activities of Daily Living Home Assistive Devices/Equipment: Gilford Rile (specify type) (bilateral hearing aids) ADL Screening (condition at time of admission) Patient's cognitive ability adequate to safely complete daily activities?: No Is the patient deaf or have difficulty hearing?: Yes Does the patient have difficulty seeing, even when wearing glasses/contacts?: No Does the patient have difficulty concentrating, remembering, or making decisions?: Yes Patient able to express need for assistance with ADLs?: Yes Does the patient have difficulty dressing or bathing?: Yes Independently performs ADLs?: No Communication: Independent Dressing (OT): Needs assistance Is this a change from baseline?: Pre-admission baseline Grooming: Needs assistance Is this a change from baseline?: Pre-admission baseline Feeding: Needs assistance Is this a change from baseline?: Pre-admission baseline Bathing: Needs assistance Is this a change from baseline?: Pre-admission baseline Toileting: Needs assistance Is this a change from baseline?: Pre-admission baseline In/Out Bed: Needs assistance Is this a change from baseline?: Pre-admission baseline Walks in Home: Dependent Is this a change from baseline?: Change from baseline, expected to last >3 days Does the patient have difficulty walking or climbing stairs?: Yes Weakness of Legs: Both Weakness of Arms/Hands: None  Permission Sought/Granted Permission sought to share information with : Case Manager Permission granted to share information with : Yes, Verbal Permission Granted  Share Information with NAME: Eilleen, [place]  Permission granted to share  info w AGENCY: SNF  Permission granted to share info w Relationship: Daughter  Permission granted to share info w  Contact Information: [phone]  Emotional Assessment Appearance:: Appears stated age Attitude/Demeanor/Rapport: Unable to Assess Affect (typically observed): Unable to Assess Orientation: : Oriented to Self Alcohol / Substance Use: Not Applicable Psych Involvement: No (comment)  Admission diagnosis:  UTI (urinary tract infection) [N39.0] Failure to thrive in adult [R62.7] Acute cystitis without hematuria [N30.00] Patient Active Problem List   Diagnosis Date Noted  . UTI (urinary tract infection) 06/12/2020   PCP:  Rudene Anda, MD Pharmacy:   [facility] [phone] - [place], [address] AT [facility] [address] [address] Phone: [phone] Fax: [phone]     Social Determinants of Health ([place]) Interventions    Readmission Risk Interventions No flowsheet data found.

## 2020-06-16 NOTE — Care Management Important Message (Signed)
Important Message  Patient Details IM Letter presented to the Patient Name: Denise Diaz MRN: [id number] Date of Birth: 1928-02-13   Medicare Important Message Given:  Yes     Kerin Salen 06/16/2020, 12:18 PM

## 2020-06-17 LAB — CBC
HCT: 38.3 % (ref 36.0–46.0)
Hemoglobin: 12.2 g/dL (ref 12.0–15.0)
MCH: 30 pg (ref 26.0–34.0)
MCHC: 31.9 g/dL (ref 30.0–36.0)
MCV: 94.1 fL (ref 80.0–100.0)
Platelets: 339 10*3/uL (ref 150–400)
RBC: 4.07 MIL/uL (ref 3.87–5.11)
RDW: 13.6 % (ref 11.5–15.5)
WBC: 6.5 10*3/uL (ref 4.0–10.5)
nRBC: 0 % (ref 0.0–0.2)

## 2020-06-17 LAB — BASIC METABOLIC PANEL
Anion gap: 9 (ref 5–15)
BUN: 12 mg/dL (ref 8–23)
CO2: 25 mmol/L (ref 22–32)
Calcium: 8.7 mg/dL — ABNORMAL LOW (ref 8.9–10.3)
Chloride: 106 mmol/L (ref 98–111)
Creatinine, Ser: 0.59 mg/dL (ref 0.44–1.00)
GFR calc Af Amer: >60 mL/min (ref >60)
GFR calc non Af Amer: >60 mL/min (ref >60)
Glucose, Bld: 93 mg/dL (ref 70–99)
Potassium: 3.5 mmol/L (ref 3.5–5.1)
Sodium: 140 mmol/L (ref 135–145)

## 2020-06-17 LAB — SARS CORONAVIRUS 2 BY RT PCR (HOSPITAL ORDER, PERFORMED IN ~~LOC~~ HOSPITAL LAB): SARS Coronavirus 2: NEGATIVE

## 2020-06-17 MED ORDER — LABETALOL HCL 5 MG/ML IV SOLN: 10.0000 mg | INTRAVENOUS | Status: DC | PRN

## 2020-06-17 MED ORDER — SODIUM CHLORIDE 0.9 % IV SOLN
INTRAVENOUS | Status: DC | PRN
  Administered 2020-06-17 – 2020-06-18 (×2): 500 mL via INTRAVENOUS

## 2020-06-17 NOTE — Evaluation (Signed)
Occupational Therapy Evaluation Patient Details Name: Denise Diaz MRN: [id number] DOB: May 01, 1928 Today's Date: 06/17/2020    History of Present Illness 86 year old female with history of dementia, Parkinson's disease, hypertension, reactive airway disorder presented to the emergency room with ongoing failure to thrive, progressive decline over the last 2 weeks, in addition to ongoing cognitive and functional decline over several months.  Pt admitted for sepsis secondary to UTI, Toxic encephalopathy, and FTT. MRI negative for acute findings 7/27   Clinical Impression   Patient with functional deficits listed below impacting patient ability to complete self care tasks. DTR present, reports patient typically able to ambulate with walker, get in/out of bed however since fall in early July and resulting T12 compression fracture patient has been declining with increased difficulty getting up. Initially patient groggy however with continued stimuli and mobility patient able to open her eyes and participate with OT. Patient max A for bed mobility and min G to min A for sitting balance due to L lateral lean. Patient also stating she is fearful of falling. Spoke with patient's DTR who is agreeable with discharge plan listed below as she and her brother (whom patient lives with) cannot provide current level of physical assist and would like patient to get stronger/more mobile.    Follow Up Recommendations  SNF;Supervision/Assistance - 24 hour    Equipment Recommendations  Other (comment) (TBD)       Precautions / Restrictions Precautions Precautions: Fall Restrictions Weight Bearing Restrictions: No      Mobility Bed Mobility Overal bed mobility: Needs Assistance Bed Mobility: Supine to Sit;Sit to Supine     Supine to sit: Max assist;HOB elevated Sit to supine: Max assist   General bed mobility comments: patient requires multimodal cues for sequencing, use of bed pad to transition trunk  to upright. max A to lift LEs onto bed, patient does initiate laying trunk to sidelying  Transfers                 General transfer comment: did not attempt, patient reports feeling mildly dizzy sitting upright and is fearful. for safety recommend x2 assist to attempt standing    Balance Overall balance assessment: Needs assistance Sitting-balance support: Bilateral upper extremity supported;Feet supported Sitting balance-Leahy Scale: Fair Sitting balance - Comments: min G to min A for sitting balance with mod cues to correct posture Postural control: Left lateral lean     Standing balance comment: did not attempt                           ADL either performed or assessed with clinical judgement   ADL Overall ADL's : Needs assistance/impaired Eating/Feeding: Set up;Bed level Eating/Feeding Details (indicate cue type and reason): able to drink from cup Grooming: Wash/dry face;Minimal assistance;Bed level Grooming Details (indicate cue type and reason): for thoroughness  Upper Body Bathing: Moderate assistance;Bed level   Lower Body Bathing: Total assistance;Bed level   Upper Body Dressing : Maximal assistance;Bed level   Lower Body Dressing: Total assistance;Bed level     Toilet Transfer Details (indicate cue type and reason): did not attempt this date, patient is fearful with sitting edge of bed, anticipate x2 assist for safety and physical assistance Toileting- Clothing Manipulation and Hygiene: Total assistance       Functional mobility during ADLs: Maximal assistance;Cueing for sequencing (bed mobility) General ADL Comments: patient requiring increased assistance for self care due to decreased strength, activity tolerance, safety awareness, balance, cognition  Vision Baseline Vision/History: Wears glasses Wears Glasses: At all times              Pertinent Vitals/Pain Pain Assessment: Faces Faces Pain Scale: No hurt Pain Intervention(s):  Repositioned;Monitored during session     Hand Dominance Right   Extremity/Trunk Assessment Upper Extremity Assessment Upper Extremity Assessment: Generalized weakness   Lower Extremity Assessment Lower Extremity Assessment: Defer to PT evaluation   Cervical / Trunk Assessment Cervical / Trunk Assessment: Kyphotic   Communication Communication Communication: HOH   Cognition Arousal/Alertness: Awake/alert;Lethargic (initial lethargic, able to arouse with mobility) Behavior During Therapy: Flat affect Overall Cognitive Status: Impaired/Different from baseline Area of Impairment: Orientation;Memory;Following commands;Safety/judgement;Problem solving                 Orientation Level: Disoriented to;Place;Time;Situation   Memory: Decreased short-term memory Following Commands: Follows one step commands with increased time Safety/Judgement: Decreased awareness of safety   Problem Solving: Slow processing;Requires verbal cues;Requires tactile cues General Comments: DTR reports patient does much better with carbidopa-levodopa on board however due to grogginess was unable to receive earlier today   General Comments  VSS on room air            [place] expects to be discharged to:: Skilled nursing facility Living Arrangements: Children Available Help at Discharge: Family;Available 24 hours/day                         Home Equipment: Walker - 2 wheels   Additional Comments: pt poor historian, gathered info from progress notes      Prior Functioning/Environment Level of Independence: Needs assistance  Gait / Transfers Assistance Needed: ambulatory to bathroom with RW ADL's / Homemaking Assistance Needed: assist for bathing, dressing   Comments: pt's DTR reports patient able to get herself in/out of bed, ambulate with walker but past 2-3 weeks has been getting harder for pt to get up/down needing more assistance        OT Problem List:  Decreased strength;Decreased activity tolerance;Impaired balance (sitting and/or standing);Decreased cognition;Decreased safety awareness      OT Treatment/Interventions: Self-care/ADL training;Therapeutic exercise;DME and/or AE instruction;Therapeutic activities;Cognitive remediation/compensation;Patient/family education;Balance training    OT Goals(Current goals can be found in the care plan section) Acute Rehab OT Goals Patient Stated Goal: go to rehab  OT Goal Formulation: With family Time For Goal Achievement: 07/01/20 Potential to Achieve Goals: Good  OT Frequency: Min 2X/week    AM-PAC OT "6 Clicks" Daily Activity     Outcome Measure Help from another person eating meals?: A Little Help from another person taking care of personal grooming?: A Little Help from another person toileting, which includes using toliet, bedpan, or urinal?: Total Help from another person bathing (including washing, rinsing, drying)?: A Lot Help from another person to put on and taking off regular upper body clothing?: A Lot Help from another person to put on and taking off regular lower body clothing?: Total 6 Click Score: 12   End of Session Nurse Communication: Mobility status  Activity Tolerance: Patient tolerated treatment well Patient left: in bed;with call bell/phone within reach;with bed alarm set;with family/visitor present  OT Visit Diagnosis: Other abnormalities of gait and mobility (R26.89);Muscle weakness (generalized) (M62.81);Other symptoms and signs involving cognitive function                Time: 1256-1335 OT Time Calculation (min): 39 min Charges:  OT General Charges $OT Visit: 1 Visit OT Evaluation $OT Eval Moderate Complexity:  1 Mod OT Treatments $Self Care/Home Management : 23-37 mins  Delbert Phenix OT Pager: [place] 06/17/2020, 2:51 PM

## 2020-06-17 NOTE — Evaluation (Signed)
Physical Therapy Evaluation Patient Details Name: Denise Diaz MRN: [id number] DOB: 10/18/1928 Today's Date: 06/17/2020   History of Present Illness  86 year old female with history of dementia, Parkinson's disease, hypertension, reactive airway disorder presented to the emergency room with ongoing failure to thrive, progressive decline over the last 2 weeks, in addition to ongoing cognitive and functional decline over several months.  Pt admitted for sepsis secondary to UTI, Toxic encephalopathy, and FTT.  Clinical Impression  Pt admitted with above diagnosis.  Pt currently with functional limitations due to the deficits listed below (see PT Problem List). Pt will benefit from skilled PT to increase their independence and safety with mobility to allow discharge to the venue listed below.  Pt assisted to sitting EOB and more awake with sitting EOB however pt wanting to return to bed and sleep.  Pt from home with son and at least ambulating to bathroom with RW however requiring assist for ADLs at baseline per chart review.  Pt would benefit from SNF prior to return home to improve mobility and strength.     Follow Up Recommendations SNF    Equipment Recommendations  None recommended by PT    Recommendations for Other Services       Precautions / Restrictions Precautions Precautions: Fall      Mobility  Bed Mobility Overal bed mobility: Needs Assistance Bed Mobility: Supine to Sit;Sit to Supine     Supine to sit: +2 for physical assistance;HOB elevated Sit to supine: +2 for physical assistance;HOB elevated   General bed mobility comments: pt requiring multimodal cues and total assist, pt not engaged with session, requesting to return to bed  Transfers                    Ambulation/Gait                Stairs            Wheelchair Mobility    Modified Rankin (Stroke Patients Only)       Balance Overall balance assessment: Needs  assistance Sitting-balance support: Bilateral upper extremity supported;Feet supported Sitting balance-Leahy Scale: Poor Sitting balance - Comments: pt was able to sit EOB (bil HHA) with only UE support for approx 2 minutes                                     Pertinent Vitals/Pain Pain Assessment: Faces Faces Pain Scale: No hurt Pain Intervention(s): Repositioned;Monitored during session    Home Living Family/patient expects to be discharged to:: Private residence Living Arrangements: Children             Home Equipment: Environmental consultant - 2 wheels Additional Comments: pt poor historian, gathered info from progress notes    Prior Function Level of Independence: Needs assistance   Gait / Transfers Assistance Needed: ambulatory to bathroom with RW  ADL's / Homemaking Assistance Needed: assist for bathing, dressing        Hand Dominance        Extremity/Trunk Assessment        Lower Extremity Assessment Lower Extremity Assessment: Generalized weakness    Cervical / Trunk Assessment Cervical / Trunk Assessment: Kyphotic  Communication   Communication: Expressive difficulties  Cognition Arousal/Alertness: Lethargic Behavior During Therapy: Flat affect Overall Cognitive Status: No family/caregiver present to determine baseline cognitive functioning  General Comments: pt would open eyes when cued and verbalized minimally, mostly requesting to return to bed/sleep      General Comments      Exercises     Assessment/Plan    PT Assessment Patient needs continued PT services  PT Problem List Decreased knowledge of use of DME;Decreased strength;Decreased activity tolerance;Decreased mobility;Decreased balance;Decreased cognition       PT Treatment Interventions Gait training;DME instruction;Therapeutic exercise;Balance training;Functional mobility training;Therapeutic activities;Patient/family education     PT Goals (Current goals can be found in the Care Plan section)  Acute Rehab PT Goals PT Goal Formulation: With patient Time For Goal Achievement: 07/01/20 Potential to Achieve Goals: Good    Frequency Min 2X/week   Barriers to discharge        Co-evaluation               AM-PAC PT "6 Clicks" Mobility  Outcome Measure Help needed turning from your back to your side while in a flat bed without using bedrails?: Total Help needed moving from lying on your back to sitting on the side of a flat bed without using bedrails?: Total Help needed moving to and from a bed to a chair (including a wheelchair)?: Total Help needed standing up from a chair using your arms (e.g., wheelchair or bedside chair)?: Total Help needed to walk in hospital room?: Total Help needed climbing 3-5 steps with a railing? : Total 6 Click Score: 6    End of Session   Activity Tolerance: Patient tolerated treatment well Patient left: with call bell/phone within reach;in bed;with bed alarm set   PT Visit Diagnosis: Other abnormalities of gait and mobility (R26.89)    Time: 3254-9826 PT Time Calculation (min) (ACUTE ONLY): 9 min   Charges:   PT Evaluation $PT Eval Low Complexity: 1 Low        Kati PT, DPT Acute Rehabilitation Services Pager: [phone] Office: [phone]  [place] Ram E 06/17/2020, 1:41 PM

## 2020-06-17 NOTE — TOC Progression Note (Addendum)
Transition of Care [facility]) - Progression Note    Patient Details  Name: Denise Diaz MRN: [id number] Date of Birth: 05-Apr-1928  Transition of Care [facility] CM/SW [place], LCSW Phone Number: 06/17/2020, 4:50 PM  Clinical Narrative:    CSW provided bed offers to the pt. Daughter and started [facility] authorization.CSW notified the physician to order a covid test.  Auth. Reference # [id number]  [facility] staff will continue to follow this pt. For discharge needs.    Expected Discharge Plan: [place] Barriers to Discharge: Continued Medical Work up, Ship broker  Expected Discharge Plan and Services Expected Discharge Plan: [place]   Discharge Planning Services: NA Post Acute Care Choice: [place] Living arrangements for the past 2 months: Single Family Home                 DME Arranged: N/A DME Agency: NA       HH Arranged: NA HH Agency: NA         Social Determinants of Health (SDOH) Interventions    Readmission Risk Interventions No flowsheet data found.

## 2020-06-17 NOTE — Progress Notes (Signed)
[facility] ([facility]) Community Based Palliative Care     This patient is enrolled in our palliative care services in the community.  [facility] will continue to follow for any discharge planning needs and to coordinate continuation of palliative care.   If you have questions or need assistance, please call [phone] or contact the hospital Liaison listed on AMION.      Melissa O'Bryant, RN, BSN [facility] Hospital Liaison  [phone]   

## 2020-06-17 NOTE — Progress Notes (Signed)
OT Cancellation Note  Patient Details Name: [place] Devin MRN: [id number] DOB: [birth year]   Cancelled Treatment:    Reason Eval/Treat Not Completed: Fatigue/lethargy limiting ability to participate. Attempted x2 this morning, RN reports patient had ativan last night for an MRI and has been sleepy/groggy since. Will re-attempt as schedule permits.  Delbert Phenix OT Pager: [place] 06/17/2020, 9:52 AM

## 2020-06-17 NOTE — Progress Notes (Addendum)
PROGRESS NOTE    Denise Diaz  PPI:[id number] DOB: [birth year] DOA: 06/12/2020 PCP: Rudene Anda, MD    Chief Complaint  Patient presents with  . Urinary Tract Infection   Brief Narrative: Denise Diaz is Denise Diaz 86 year old female with history of dementia, Parkinson's disease, hypertension, reactive airway disorder presented to the emergency room with ongoing failure to thrive, progressive decline over the last 2 weeks, in addition to ongoing cognitive and functional decline over several months. -Work-up in the emergency room noted normal vital signs, mild leukocytosis, UA with greater than 50 WBCs, positive nitrite, CT head noted advanced atrophy, chest x-ray was unremarkable, other electrolytes were within normal limits. -She was admitted and started on IV fluids along with ceftriaxone -mentation slowly improving and less lethargic as days went by -7/27 noon noted to have garbled, nonsensical speech, worse than previously  Assessment & Plan:   Active Problems:   UTI (urinary tract infection)  Sepsis secondary to Proteus Mirabilis UTI Toxic encephalopathy Fever -Lethargic today, per nursing, this has been the case since she received benzo prior to MRI - suspect 2/2 delirium with benzo, will continue to monitor -Treated with IV ceftriaxone, urine cultures grew proteus mirabilis  -Now on Ancef (plan for 7 total days of abx) - fever curve, worsened yesterday -> will continue to monitor - consider abdominal imaging -yesterday noted to have garbled, nonsensical speech, concern yesterday for stroke - MRI 7/27 without acute intracranial abnormality - continued lethargy today likely related to benzo yesterday, suspect yesterday was with delirium - normal ammonia, tsh  - delirium precautions -Up in the chair, PT OT today  -Continue home meds including modafinil   Adult failure to thrive -The setting of above infections and underlying dementia with Parkinson's disease -Monitor with  treatment of above, resume home meds including modafinil -check MRI brain  As above  Recent T12 vertebral compression fracture -Supportive care, PT eval  Dementia Parkinson's disease -Resume Sinemet and Exelon patch  History of breast cancer -Continue Arimidex  History of narcolepsy -Continue modafinil   DVT prophylaxis: lovenox Code Status: DNR Family Communication: none at bedside - called son, no answer Disposition:   Status is: Inpatient  Remains inpatient appropriate because:Inpatient level of care appropriate due to severity of illness   Dispo: The patient is from: Home              Anticipated d/c is to: SNF              Anticipated d/c date is: 2 days              Patient currently is not medically stable to d/c.  Consultants:   none  Procedures: ( none  Antimicrobials:  Anti-infectives (From admission, onward)   Start     Dose/Rate Route Frequency Ordered Stop   06/14/20 2000  ceFAZolin (ANCEF) IVPB 1 g/50 mL premix     Discontinue     1 g 100 mL/hr over 30 Minutes Intravenous Every 8 hours 06/14/20 1408     06/13/20 2000  cefTRIAXone (ROCEPHIN) 1 g in sodium chloride 0.9 % 100 mL IVPB  Status:  Discontinued        1 g 200 mL/hr over 30 Minutes Intravenous Every 24 hours 06/12/20 2103 06/14/20 1408   06/12/20 2030  cefTRIAXone (ROCEPHIN) 1 g in sodium chloride 0.9 % 100 mL IVPB        1 g 200 mL/hr over 30 Minutes Intravenous  Once 06/12/20 2028 06/12/20 2307  Subjective: lethargic  Objective: Vitals:   06/17/20 0518 06/17/20 0926 06/17/20 1211 06/17/20 1446  BP: (!) 138/67   (!) 135/89  Pulse: 62  57 67  Resp: 16   16  Temp: 98.7 F (37.1 C)   99.5 F (37.5 C)  TempSrc:      SpO2: 97%  96% 96%  Weight:  65.8 kg    Height:  5\' 6"  (1.676 m)      Intake/Output Summary (Last 24 hours) at 06/17/2020 1755 Last data filed at 06/17/2020 1730 Gross per 24 hour  Intake 144.57 ml  Output 550 ml  Net -405.43 ml   Filed Weights    06/17/20 0926  Weight: 65.8 kg    Examination:  General exam: Appears calm and comfortable  Respiratory system: Clear to auscultation. Respiratory effort normal. Cardiovascular system: S1 & S2 heard, RRR Gastrointestinal system: Abdomen is nondistended, soft and nontender. N Central nervous system: lethargic, moans unintelligibly, withdraws from painful stimuli  Extremities: no LEE Skin: No rashes, lesions or ulcers Psychiatry: Judgement and insight appear normal. Mood & affect appropriate.     Data Reviewed: I have personally reviewed following labs and imaging studies  CBC: Recent Labs  Lab 06/12/20 1615 06/13/20 0342 06/14/20 0335 06/15/20 0422 06/17/20 0241  WBC 11.3* 8.6 7.7 9.5 6.5  NEUTROABS 8.7*  --   --   --   --   HGB 14.8 12.2 13.1 12.6 12.2  HCT 46.6* 39.1 42.5 39.6 38.3  MCV 94.3 95.8 96.8 93.8 94.1  PLT 452* 402* 419* 409* 161    Basic Metabolic Panel: Recent Labs  Lab 06/12/20 1615 06/13/20 0342 06/14/20 0335 06/15/20 0422 06/17/20 0241  NA 140 139 140 140 140  K 4.1 3.9 3.8 3.5 3.5  CL 104 107 108 106 106  CO2 24 24 24 25 25   GLUCOSE 95 86 78 100* 93  BUN 20 17 16 16 12   CREATININE 0.76 0.67 0.78 0.68 0.59  CALCIUM 9.4 8.6* 8.7* 8.7* 8.7*    GFR: Estimated Creatinine Clearance: 43.8 mL/min (by C-G formula based on SCr of 0.59 mg/dL).  Liver Function Tests: No results for input(s): AST, ALT, ALKPHOS, BILITOT, PROT, ALBUMIN in the last 168 hours.  CBG: Recent Labs  Lab 06/13/20 1025  GLUCAP 77     Recent Results (from the past 240 hour(s))  Urine culture     Status: Abnormal   Collection Time: 06/12/20  6:01 PM   Specimen: Urine, Catheterized  Result Value Ref Range Status   Specimen Description   Final    URINE, CATHETERIZED Performed at [facility] [address] [place], [place] [address]    Special Requests   Final    NONE Performed at [facility], [place] [address]  [place], [place] [address]    Culture >=100,000 COLONIES/mL PROTEUS MIRABILIS (Amish Mintzer)  Final   Report Status 06/14/2020 FINAL  Final   Organism ID, Bacteria PROTEUS MIRABILIS (Yoona Ishii)  Final      Susceptibility   Proteus mirabilis - MIC*    AMPICILLIN >=32 RESISTANT Resistant     CEFAZOLIN 8 SENSITIVE Sensitive     CEFTRIAXONE <=0.25 SENSITIVE Sensitive     CIPROFLOXACIN <=0.25 SENSITIVE Sensitive     GENTAMICIN >=16 RESISTANT Resistant     IMIPENEM 2 SENSITIVE Sensitive     NITROFURANTOIN 128 RESISTANT Resistant     TRIMETH/SULFA <=20 SENSITIVE Sensitive     AMPICILLIN/SULBACTAM 16 INTERMEDIATE Intermediate     PIP/TAZO <=4  SENSITIVE Sensitive     * >=100,000 COLONIES/mL PROTEUS MIRABILIS  SARS Coronavirus 2 by RT PCR (hospital order, performed in [facility] hospital lab) Nasopharyngeal Nasopharyngeal Swab     Status: None   Collection Time: 06/12/20  8:47 PM   Specimen: Nasopharyngeal Swab  Result Value Ref Range Status   SARS Coronavirus 2 NEGATIVE NEGATIVE Final    Comment: (NOTE) SARS-CoV-2 target nucleic acids are NOT DETECTED.  The SARS-CoV-2 RNA is generally detectable in upper and lower respiratory specimens during the acute phase of infection. The lowest concentration of SARS-CoV-2 viral copies this assay can detect is 250 copies / mL. Maxximus Gotay negative result does not preclude SARS-CoV-2 infection and should not be used as the sole basis for treatment or other patient management decisions.  Anaaya Fuster negative result may occur with improper specimen collection / handling, submission of specimen other than nasopharyngeal swab, presence of viral mutation(s) within the areas targeted by this assay, and inadequate number of viral copies (<250 copies / mL). Nolyn Swab negative result must be combined with clinical observations, patient history, and epidemiological information.  Fact Sheet for Patients:   StrictlyIdeas.no  Fact Sheet for Healthcare  Providers: BankingDealers.co.za  This test is not yet approved or  cleared by the [place] FDA and has been authorized for detection and/or diagnosis of SARS-CoV-2 by FDA under an Emergency Use Authorization (EUA).  This EUA will remain in effect (meaning this test can be used) for the duration of the COVID-19 declaration under Section 564(b)(1) of the Act, 21 U.S.C. section 360bbb-3(b)(1), unless the authorization is terminated or revoked sooner.  Performed at [facility], [place] [address], [place], [place] [address]          Radiology Studies: MR BRAIN WO CONTRAST  Result Date: 06/16/2020 CLINICAL DATA:  86 year old female status post unwitnessed fall on 05/31/2020. Altered mental status. EXAM: MRI HEAD WITHOUT CONTRAST TECHNIQUE: Multiplanar, multiecho pulse sequences of the brain and surrounding structures were obtained without intravenous contrast. COMPARISON:  Head CT 06/12/2020.  Brain MRI 10/16/2017. FINDINGS: Brain: No restricted diffusion to suggest acute infarction. No midline shift, mass effect, evidence of mass lesion, ventriculomegaly, extra-axial collection or acute intracranial hemorrhage. Cervicomedullary junction and pituitary are within normal limits. Mild for age cerebral volume loss and scattered nonspecific white matter T2/FLAIR hyperintensity has not significantly changed since 2018. Similar mild T2 heterogeneity in the deep gray nuclei. No cortical encephalomalacia or chronic cerebral blood products. Vascular: Major intracranial vascular flow voids are stable since 2018. Skull and upper cervical spine: Negative for age visible cervical spine. Visualized bone marrow signal is within normal limits. Sinuses/Orbits: Stable, negative. Other: Bilateral mastoid effusions are new since 2018. Negative visible pharynx. Other internal auditory structures appear grossly normal. Normal stylomastoid foramina. Negative scalp and face soft  tissues. IMPRESSION: 1. No acute intracranial abnormality. Largely unremarkable for age noncontrast MRI appearance of brain. 2. Bilateral mastoid effusions are new since 2018 and likely postinflammatory. Electronically Signed   By: Genevie Ann M.D.   On: 06/16/2020 16:39        Scheduled Meds: . carbidopa-levodopa  1 tablet Oral BID  . enoxaparin (LOVENOX) injection  40 mg Subcutaneous Q24H  . escitalopram  10 mg Oral Daily  . feeding supplement (ENSURE ENLIVE)  237 mL Oral BID BM  . memantine  10 mg Oral Daily  . modafinil  100 mg Oral Daily  . rivastigmine  4.6 mg Transdermal Daily   Continuous Infusions: . sodium chloride Stopped (06/17/20 1202)  .  ceFAZolin (ANCEF) IV 1 g (06/17/20 1104)     LOS: 5 days    Time spent: over 67 min    Fayrene Helper, MD [facility]   To contact the attending provider between 7A-7P or the covering provider during after hours 7P-7A, please log into the web site [email or web address] and access using universal [place] password for that web site. If you do not have the password, please call the hospital operator.  06/17/2020, 5:55 PM

## 2020-06-18 ENCOUNTER — Other Ambulatory Visit: Payer: Medicare Other | Admitting: Hospice

## 2020-06-18 LAB — COMPREHENSIVE METABOLIC PANEL
ALT: 7 U/L (ref 0–44)
AST: 43 U/L — ABNORMAL HIGH (ref 15–41)
Albumin: 3 g/dL — ABNORMAL LOW (ref 3.5–5.0)
Alkaline Phosphatase: 63 U/L (ref 38–126)
Anion gap: 9 (ref 5–15)
BUN: 18 mg/dL (ref 8–23)
CO2: 25 mmol/L (ref 22–32)
Calcium: 8.8 mg/dL — ABNORMAL LOW (ref 8.9–10.3)
Chloride: 106 mmol/L (ref 98–111)
Creatinine, Ser: 0.61 mg/dL (ref 0.44–1.00)
GFR calc Af Amer: >60 mL/min (ref >60)
GFR calc non Af Amer: >60 mL/min (ref >60)
Glucose, Bld: 89 mg/dL (ref 70–99)
Potassium: 3.7 mmol/L (ref 3.5–5.1)
Sodium: 140 mmol/L (ref 135–145)
Total Bilirubin: 0.3 mg/dL (ref 0.3–1.2)
Total Protein: 6.1 g/dL — ABNORMAL LOW (ref 6.5–8.1)

## 2020-06-18 LAB — CBC WITH DIFFERENTIAL/PLATELET
Abs Immature Granulocytes: 0.08 10*3/uL — ABNORMAL HIGH (ref 0.00–0.07)
Basophils Absolute: 0.1 10*3/uL (ref 0.0–0.1)
Basophils Relative: 1 %
Eosinophils Absolute: 0.1 10*3/uL (ref 0.0–0.5)
Eosinophils Relative: 1 %
HCT: 39.7 % (ref 36.0–46.0)
Hemoglobin: 12.7 g/dL (ref 12.0–15.0)
Immature Granulocytes: 1 %
Lymphocytes Relative: 28 %
Lymphs Abs: 2.2 10*3/uL (ref 0.7–4.0)
MCH: 30 pg (ref 26.0–34.0)
MCHC: 32 g/dL (ref 30.0–36.0)
MCV: 93.6 fL (ref 80.0–100.0)
Monocytes Absolute: 0.7 10*3/uL (ref 0.1–1.0)
Monocytes Relative: 9 %
Neutro Abs: 4.7 10*3/uL (ref 1.7–7.7)
Neutrophils Relative %: 60 %
Platelets: 377 10*3/uL (ref 150–400)
RBC: 4.24 MIL/uL (ref 3.87–5.11)
RDW: 13.7 % (ref 11.5–15.5)
WBC: 7.8 10*3/uL (ref 4.0–10.5)
nRBC: 0 % (ref 0.0–0.2)

## 2020-06-18 LAB — MAGNESIUM: Magnesium: 2.2 mg/dL (ref 1.7–2.4)

## 2020-06-18 LAB — PHOSPHORUS: Phosphorus: 2.4 mg/dL — ABNORMAL LOW (ref 2.5–4.6)

## 2020-06-18 NOTE — Plan of Care (Signed)
  Problem: Pain Managment: Goal: General experience of comfort will improve Outcome: Progressing   Problem: Safety: Goal: Ability to remain free from injury will improve Outcome: Progressing   

## 2020-06-18 NOTE — Plan of Care (Signed)

## 2020-06-18 NOTE — Progress Notes (Signed)
PROGRESS NOTE    Denise Diaz  IDP:[id number] DOB: 1927/12/30 DOA: 06/12/2020 PCP: Rudene Anda, MD    Chief Complaint  Patient presents with  . Urinary Tract Infection   Brief Narrative: [name] is Denise Diaz 86 year old female with history of dementia, Parkinson's disease, hypertension, reactive airway disorder presented to the emergency room with ongoing failure to thrive, progressive decline over the last 2 weeks, in addition to ongoing cognitive and functional decline over several months. -Work-up in the emergency room noted normal vital signs, mild leukocytosis, UA with greater than 50 WBCs, positive nitrite, CT head noted advanced atrophy, chest x-ray was unremarkable, other electrolytes were within normal limits. -She was admitted and started on IV fluids along with ceftriaxone -mentation slowly improving and less lethargic as days went by -7/27 noon noted to have garbled, nonsensical speech, worse than previously  Assessment & Plan:   Active Problems:   UTI (urinary tract infection)  Sepsis secondary to Proteus Mirabilis UTI Toxic encephalopathy Fever -Lethargic today, per nursing, this has been the case since she received benzo prior to MRI - suspect 2/2 delirium with benzo, will continue to monitor -Treated with IV ceftriaxone, urine cultures grew proteus mirabilis  -Now on Ancef (plan for 7 total days of abx) - fever curve, worsened 7/26 am -> will continue to monitor - consider abdominal imaging or additional workup if recurrent -yesterday noted to have garbled, nonsensical speech, concern yesterday for stroke - MRI 7/27 without acute intracranial abnormality - continued lethargy today likely related to benzo yesterday, suspect yesterday was with delirium - normal ammonia, tsh  - delirium precautions -Up in the chair, PT OT today  -Continue home meds including modafinil   Adult failure to thrive -The setting of above infections and underlying dementia with  Parkinson's disease -Monitor with treatment of above, resume home meds including modafinil -check MRI brain  As above  Recent T12 vertebral compression fracture -Supportive care, PT eval  Dementia Parkinson's disease -Resume Sinemet and Exelon patch  History of breast cancer -Continue Arimidex  History of narcolepsy -Continue modafinil   DVT prophylaxis: lovenox Code Status: DNR Family Communication: daughter over phone Disposition:   Status is: Inpatient  Remains inpatient appropriate because:Inpatient level of care appropriate due to severity of illness   Dispo: The patient is from: Home              Anticipated d/c is to: SNF              Anticipated d/c date is: 2 days              Patient currently is not medically stable to d/c.  Consultants:   none  Procedures: ( none  Antimicrobials:  Anti-infectives (From admission, onward)   Start     Dose/Rate Route Frequency Ordered Stop   06/14/20 2000  ceFAZolin (ANCEF) IVPB 1 g/50 mL premix     Discontinue     1 g 100 mL/hr over 30 Minutes Intravenous Every 8 hours 06/14/20 1408     06/13/20 2000  cefTRIAXone (ROCEPHIN) 1 g in sodium chloride 0.9 % 100 mL IVPB  Status:  Discontinued        1 g 200 mL/hr over 30 Minutes Intravenous Every 24 hours 06/12/20 2103 06/14/20 1408   06/12/20 2030  cefTRIAXone (ROCEPHIN) 1 g in sodium chloride 0.9 % 100 mL IVPB        1 g 200 mL/hr over 30 Minutes Intravenous  Once 06/12/20 2028 06/12/20 2307  Subjective: Denise Diaz&Ox1, hard of hearing, no complaints  Objective: Vitals:   06/17/20 1446 06/17/20 2218 06/18/20 0501 06/18/20 1418  BP: (!) 135/89 107/70 (!) 159/83 (!) 132/57  Pulse: 67 75 73 69  Resp: 16 16 16 16   Temp: 99.5 F (37.5 C) 98 F (36.7 C) 98.6 F (37 C) 98.3 F (36.8 C)  TempSrc:    Oral  SpO2: 96% 99% 99% 99%  Weight:      Height:        Intake/Output Summary (Last 24 hours) at 06/18/2020 1907 Last data filed at 06/18/2020 1800 Gross per  24 hour  Intake 588.54 ml  Output 400 ml  Net 188.54 ml   Filed Weights   06/17/20 0926  Weight: 65.8 kg    Examination:  General: No acute distress.  Sitting up getting ready to eat breakfast. Cardiovascular: Heart sounds show Denise Diaz regular rate, and rhythm.  Lungs: Clear to auscultation bilaterally . Abdomen: Soft, nontender, nondistended  Neurological: Alert and oriented 3. Moves all extremities 4. Cranial nerves II through XII grossly intact. Skin: Warm and dry. No rashes or lesions. Extremities: No clubbing or cyanosis. No edema.   Data Reviewed: I have personally reviewed following labs and imaging studies  CBC: Recent Labs  Lab 06/12/20 1615 06/12/20 1615 06/13/20 0342 06/14/20 0335 06/15/20 0422 06/17/20 0241 06/18/20 0229  WBC 11.3*   < > 8.6 7.7 9.5 6.5 7.8  NEUTROABS 8.7*  --   --   --   --   --  4.7  HGB 14.8   < > 12.2 13.1 12.6 12.2 12.7  HCT 46.6*   < > 39.1 42.5 39.6 38.3 39.7  MCV 94.3   < > 95.8 96.8 93.8 94.1 93.6  PLT 452*   < > 402* 419* 409* 339 377   < > = values in this interval not displayed.    Basic Metabolic Panel: Recent Labs  Lab 06/13/20 0342 06/14/20 0335 06/15/20 0422 06/17/20 0241 06/18/20 0229  NA 139 140 140 140 140  K 3.9 3.8 3.5 3.5 3.7  CL 107 108 106 106 106  CO2 24 24 25 25 25   GLUCOSE 86 78 100* 93 89  BUN 17 16 16 12 18   CREATININE 0.67 0.78 0.68 0.59 0.61  CALCIUM 8.6* 8.7* 8.7* 8.7* 8.8*  MG  --   --   --   --  2.2  PHOS  --   --   --   --  2.4*    GFR: Estimated Creatinine Clearance: 43.8 mL/min (by C-G formula based on SCr of 0.61 mg/dL).  Liver Function Tests: Recent Labs  Lab 06/18/20 0229  AST 43*  ALT 7  ALKPHOS 63  BILITOT 0.3  PROT 6.1*  ALBUMIN 3.0*    CBG: Recent Labs  Lab 06/13/20 1025  GLUCAP 77     Recent Results (from the past 240 hour(s))  Urine culture     Status: Abnormal   Collection Time: 06/12/20  6:01 PM   Specimen: Urine, Catheterized  Result Value Ref Range Status     Specimen Description   Final    URINE, CATHETERIZED Performed at [facility] [address], [place], [place] [address]    Special Requests   Final    NONE Performed at [facility], [place] [address], [place], [place] [address]    Culture >=100,000 COLONIES/mL PROTEUS MIRABILIS (Edrik Rundle)  Final   Report Status 06/14/2020 FINAL  Final   Organism ID,  Bacteria PROTEUS MIRABILIS (Kavita Bartl)  Final      Susceptibility   Proteus mirabilis - MIC*    AMPICILLIN >=32 RESISTANT Resistant     CEFAZOLIN 8 SENSITIVE Sensitive     CEFTRIAXONE <=0.25 SENSITIVE Sensitive     CIPROFLOXACIN <=0.25 SENSITIVE Sensitive     GENTAMICIN >=16 RESISTANT Resistant     IMIPENEM 2 SENSITIVE Sensitive     NITROFURANTOIN 128 RESISTANT Resistant     TRIMETH/SULFA <=20 SENSITIVE Sensitive     AMPICILLIN/SULBACTAM 16 INTERMEDIATE Intermediate     PIP/TAZO <=4 SENSITIVE Sensitive     * >=100,000 COLONIES/mL PROTEUS MIRABILIS  SARS Coronavirus 2 by RT PCR (hospital order, performed in [place] hospital lab) Nasopharyngeal Nasopharyngeal Swab     Status: None   Collection Time: 06/12/20  8:47 PM   Specimen: Nasopharyngeal Swab  Result Value Ref Range Status   SARS Coronavirus 2 NEGATIVE NEGATIVE Final    Comment: (NOTE) SARS-CoV-2 target nucleic acids are NOT DETECTED.  The SARS-CoV-2 RNA is generally detectable in upper and lower respiratory specimens during the acute phase of infection. The lowest concentration of SARS-CoV-2 viral copies this assay can detect is 250 copies / mL. Oluchi Pucci negative result does not preclude SARS-CoV-2 infection and should not be used as the sole basis for treatment or other patient management decisions.  Rogue Pautler negative result may occur with improper specimen collection / handling, submission of specimen other than nasopharyngeal swab, presence of viral mutation(s) within the areas targeted by this assay, and inadequate number of viral copies (<250 copies /  mL). Noelene Gang negative result must be combined with clinical observations, patient history, and epidemiological information.  Fact Sheet for Patients:   StrictlyIdeas.no  Fact Sheet for Healthcare Providers: BankingDealers.co.za  This test is not yet approved or  cleared by the [place] FDA and has been authorized for detection and/or diagnosis of SARS-CoV-2 by FDA under an Emergency Use Authorization (EUA).  This EUA will remain in effect (meaning this test can be used) for the duration of the COVID-19 declaration under Section 564(b)(1) of the Act, 21 U.S.C. section 360bbb-3(b)(1), unless the authorization is terminated or revoked sooner.  Performed at [facility], [place] [address] [place], [place] [address]   SARS Coronavirus 2 by RT PCR (hospital order, performed in [facility] hospital lab) Nasopharyngeal Nasopharyngeal Swab     Status: None   Collection Time: 06/17/20  4:50 PM   Specimen: Nasopharyngeal Swab  Result Value Ref Range Status   SARS Coronavirus 2 NEGATIVE NEGATIVE Final    Comment: (NOTE) SARS-CoV-2 target nucleic acids are NOT DETECTED.  The SARS-CoV-2 RNA is generally detectable in upper and lower respiratory specimens during the acute phase of infection. The lowest concentration of SARS-CoV-2 viral copies this assay can detect is 250 copies / mL. Reese Stockman negative result does not preclude SARS-CoV-2 infection and should not be used as the sole basis for treatment or other patient management decisions.  Brendon Christoffel negative result may occur with improper specimen collection / handling, submission of specimen other than nasopharyngeal swab, presence of viral mutation(s) within the areas targeted by this assay, and inadequate number of viral copies (<250 copies / mL). Akila Batta negative result must be combined with clinical observations, patient history, and epidemiological information.  Fact Sheet for Patients:    StrictlyIdeas.no  Fact Sheet for Healthcare Providers: BankingDealers.co.za  This test is not yet approved or  cleared by the [place] FDA and has been authorized for detection and/or diagnosis of  SARS-CoV-2 by FDA under an Emergency Use Authorization (EUA).  This EUA will remain in effect (meaning this test can be used) for the duration of the COVID-19 declaration under Section 564(b)(1) of the Act, 21 U.S.C. section 360bbb-3(b)(1), unless the authorization is terminated or revoked sooner.  Performed at [facility], [place] [address] [place], [place] [address]          Radiology Studies: No results found.      Scheduled Meds: . carbidopa-levodopa  1 tablet Oral BID  . enoxaparin (LOVENOX) injection  40 mg Subcutaneous Q24H  . escitalopram  10 mg Oral Daily  . feeding supplement (ENSURE ENLIVE)  237 mL Oral BID BM  . memantine  10 mg Oral Daily  . modafinil  100 mg Oral Daily  . rivastigmine  4.6 mg Transdermal Daily   Continuous Infusions: . sodium chloride 10 mL/hr at 06/18/20 0200  .  ceFAZolin (ANCEF) IV 1 g (06/18/20 1144)     LOS: 6 days    Time spent: over 30 min    Fayrene Helper, MD [facility]   To contact the attending provider between 7A-7P or the covering provider during after hours 7P-7A, please log into the web site [email or web address] and access using universal [place] password for that web site. If you do not have the password, please call the hospital operator.  06/18/2020, 7:07 PM

## 2020-06-18 NOTE — TOC Progression Note (Signed)
Transition of Care [facility] - Progression Note    Patient Details  Name: Denise Diaz MRN: [id number] Date of Birth: January 07, 1928  Transition of Care [facility]) CM/SW [place], [place] Phone Number:  06/18/2020, 3:41 PM  Clinical Narrative:    Patient daughter followed up w/SNF choice [facility]. CSW called [facility] to confirm a bed. Per Admission Coordinator, they will not have a bed until Saturday. CSW notified daughter the pt. Maybe medically stable for transfer 7/30.  Patient daughter express if the patient cannot get a bed at [facility] she prefers to take with patient home under hospice care. Patient is currently active with [facility] for palliative services. CSW notified the physician.   TOC staff will continue to follow for discharge needs.   Expected Discharge Plan: [place] Barriers to Discharge: Continued Medical Work up  Expected Discharge Plan and Services Expected Discharge Plan: [place]   Discharge Planning Services: NA Post Acute Care Choice: [place] Living arrangements for the past 2 months: Single Family Home                 DME Arranged: N/A DME Agency: NA       HH Arranged: NA HH Agency: NA         Social Determinants of Health (SDOH) Interventions    Readmission Risk Interventions No flowsheet data found.

## 2020-06-19 LAB — CBC WITH DIFFERENTIAL/PLATELET
Abs Immature Granulocytes: 0.06 10*3/uL (ref 0.00–0.07)
Basophils Absolute: 0.1 10*3/uL (ref 0.0–0.1)
Basophils Relative: 1 %
Eosinophils Absolute: 0.1 10*3/uL (ref 0.0–0.5)
Eosinophils Relative: 1 %
HCT: 40.1 % (ref 36.0–46.0)
Hemoglobin: 13 g/dL (ref 12.0–15.0)
Immature Granulocytes: 1 %
Lymphocytes Relative: 32 %
Lymphs Abs: 2.4 10*3/uL (ref 0.7–4.0)
MCH: 29.7 pg (ref 26.0–34.0)
MCHC: 32.4 g/dL (ref 30.0–36.0)
MCV: 91.8 fL (ref 80.0–100.0)
Monocytes Absolute: 0.7 10*3/uL (ref 0.1–1.0)
Monocytes Relative: 9 %
Neutro Abs: 4.1 10*3/uL (ref 1.7–7.7)
Neutrophils Relative %: 56 %
Platelets: 345 10*3/uL (ref 150–400)
RBC: 4.37 MIL/uL (ref 3.87–5.11)
RDW: 13.5 % (ref 11.5–15.5)
WBC: 7.4 10*3/uL (ref 4.0–10.5)
nRBC: 0 % (ref 0.0–0.2)

## 2020-06-19 LAB — COMPREHENSIVE METABOLIC PANEL
ALT: 6 U/L (ref 0–44)
AST: 34 U/L (ref 15–41)
Albumin: 3.2 g/dL — ABNORMAL LOW (ref 3.5–5.0)
Alkaline Phosphatase: 67 U/L (ref 38–126)
Anion gap: 8 (ref 5–15)
BUN: 16 mg/dL (ref 8–23)
CO2: 24 mmol/L (ref 22–32)
Calcium: 8.6 mg/dL — ABNORMAL LOW (ref 8.9–10.3)
Chloride: 106 mmol/L (ref 98–111)
Creatinine, Ser: 0.66 mg/dL (ref 0.44–1.00)
GFR calc Af Amer: >60 mL/min (ref >60)
GFR calc non Af Amer: >60 mL/min (ref >60)
Glucose, Bld: 100 mg/dL — ABNORMAL HIGH (ref 70–99)
Potassium: 3.6 mmol/L (ref 3.5–5.1)
Sodium: 138 mmol/L (ref 135–145)
Total Bilirubin: 0.6 mg/dL (ref 0.3–1.2)
Total Protein: 6.2 g/dL — ABNORMAL LOW (ref 6.5–8.1)

## 2020-06-19 LAB — PHOSPHORUS: Phosphorus: 2.2 mg/dL — ABNORMAL LOW (ref 2.5–4.6)

## 2020-06-19 LAB — MAGNESIUM: Magnesium: 2.3 mg/dL (ref 1.7–2.4)

## 2020-06-19 NOTE — TOC Transition Note (Addendum)
Transition of Care [facility]) - CM/SW Discharge Note   Patient Details  Name: Denise Diaz MRN: [id number] Date of Birth: [birth year]  Transition [facility]) CM/SW Contact:  Lia Hopping, [place] Phone Number: 06/19/2020, 2:55 PM   Clinical Narrative:    PTAR arranged for transport.  CSW faxed copy of the pt. Vaccine record to SNF and placed a copy on pt. Chart to be scanned into epic.  D/C Summary faxed. CSW confirmed the facility is ready to accept the patient.  No other needs identified.    Final next level of care: Skilled Nursing Facility Barriers to Discharge: No Barriers Identified   Patient Goals and CMS Choice Patient states their goals for this hospitalization and ongoing recovery are:: Per daughter, go to rehab CMS Medicare.gov Compare Post Acute Care list provided to:: Other (Comment Required) (Daughter) Choice offered to / list presented to : Adult Children  Discharge Placement PASRR number recieved: 06/16/20            Patient chooses bed at: [facility] Patient to be transferred to facility by: [place] Name of family member notified: Daughter [name] Patient and family notified of of transfer: 06/19/20  Discharge Plan and Services   Discharge Planning Services: NA Post Acute Care Choice: [place]          DME Arranged: N/A DME Agency: NA       HH Arranged: NA HH Agency: NA        Social Determinants of Health (SDOH) Interventions     Readmission Risk Interventions No flowsheet data found.

## 2020-06-19 NOTE — Progress Notes (Signed)
Report called to [facility] awaiting transport from [place]

## 2020-06-19 NOTE — Discharge Summary (Signed)
Physician Discharge Summary  Denise Diaz EHU:[id number] DOB: 09/13/1928 DOA: 06/12/2020  PCP: Rudene Anda, MD  Admit date: 06/12/2020 Discharge date: 06/19/2020  Time spent: 40 minutes  Recommendations for Outpatient Follow-up:  1. Follow outpatient CBC/CMP 2. Delirium precautions 3. Please have palliative follow as an outpatient    Discharge Diagnoses:  Active Problems:   UTI (urinary tract infection)   Discharge Condition: stable  Diet recommendation: dysphagia 3 diet, thin liquids  Filed Weights   06/17/20 0926  Weight: 65.8 kg    History of present illness:  [name] is Denise Diaz 86 year old female with history of dementia, Parkinson's disease, hypertension, reactive airway disorder presented to the emergency room with ongoing failure to thrive, progressive decline over the last 2weeks, in addition to ongoing cognitive and functional decline over several months. -Work-up in the emergency room noted normal vital signs, mild leukocytosis, UA with greater than 50 WBCs, positive nitrite, CT head noted advanced atrophy, chest x-ray was unremarkable, other electrolytes were within normal limits. -She was admitted and started on IV fluids along with ceftriaxone for concern for Denise Diaz UTI -mentation slowly improving and less lethargic as days went by -7/27 noon noted to have garbled, nonsensical speech, worse than previously - MRI negative for stroke  She was admitted with [facility] UTI in the setting of progressive decline.  She's Diaz after antibiotic treatment.  Hospitalization complicated by concern for speech abnormality, MRI was obtained and was negative for stroke.  Hospitalization complicated by delirium, but she seems closer to baseline at this time.  Plan for discharge today to SNF.   See below for additional details   Hospital Course:  Denise Diaz UTI Toxic metabolic encephalopathy Fever - Sepsis ruled out (on presentation, leukocytosis, but without tachy, fever, or  respiratory abnormality) - Diaz mental status today -Treated with IV ceftriaxone, urine cultures grew Denise Diaz  - Now on Ancef(s/p for 7 total days of abx) - fever x1 on 7/26 am -> continue to monitor - consider abdominal imaging or additional workup if recurrent (no recurrent fever or worsened WBC on day of discharge) -7/27 noted to have garbled, nonsensical speech, concern for stroke - MRI 7/27 without acute intracranial abnormality - lethargy on 7/28 thought 2/2 benzo, now Diaz - normal ammonia, tsh  - delirium precautions -Up in the chair, PT OT today -Continue home meds including modafinil  Adult failure to thrive -The setting of above infections and underlying dementia with Parkinson's disease -Monitor with treatment of above, resume home meds including modafinil -check MRI brain As above  Recent T12 vertebral compression fracture -Supportive care, PTeval  Dementia Parkinson's disease -Resume Sinemet and Exelon patch  History of breast cancer -Continue Arimidex  History of narcolepsy -Continue modafinil   Procedures:  none  Consultations:  none  Discharge Exam: Vitals:   06/19/20 0501 06/19/20 1428  BP: (!) 167/65 (!) 181/84  Pulse: 66 76  Resp: 16 16  Temp: 98.5 F (36.9 C) 98 F (36.7 C)  SpO2: 98% 100%   Asking to be adjusted in bed Hard of hearing, pleasantly confused Discussed dc plan with daughter over phone  General: No acute distress. Cardiovascular: Heart sounds show Denise Diaz regular rate, and rhythm.  Lungs: Clear to auscultation bilaterally  Abdomen: Soft, nontender, nondistended  Neurological: Alert, but confused, hard of hearing. Moves all extremities 4. Cranial nerves II through XII grossly intact. Skin: Warm and dry. No rashes or lesions. Extremities: No clubbing or cyanosis. No edema.   Discharge Instructions   Discharge Instructions  Call MD for:  difficulty breathing, headache or visual disturbances    Complete by: As directed    Call MD for:  extreme fatigue   Complete by: As directed    Call MD for:  hives   Complete by: As directed    Call MD for:  persistant dizziness or light-headedness   Complete by: As directed    Call MD for:  persistant nausea and vomiting   Complete by: As directed    Call MD for:  redness, tenderness, or signs of infection (pain, swelling, redness, odor or green/yellow discharge around incision site)   Complete by: As directed    Call MD for:  severe uncontrolled pain   Complete by: As directed    Call MD for:  temperature >100.4   Complete by: As directed    Diet - low sodium heart healthy   Complete by: As directed    Discharge instructions   Complete by: As directed    You were seen for Denise Diaz urinary tract infection.Denise Diaz after 7 days of antibiotics.  We did an MRI with concern for stroke which was negative.    Your delirium has Diaz.   Return for new, recurrent, or worsening symptoms.  Please ask your PCP to request records from this hospitalization so they know what was done and what the next steps will be.   Increase activity slowly   Complete by: As directed      Allergies as of 06/19/2020   No Known Allergies     Medication List    STOP taking these medications   amoxicillin 250 MG capsule Commonly known as: AMOXIL   cyanocobalamin 1000 MCG/ML injection Commonly known as: (VITAMIN B-12)   meloxicam 7.5 MG tablet Commonly known as: MOBIC   traMADol 50 MG tablet Commonly known as: ULTRAM     TAKE these medications   anastrozole 1 MG tablet Commonly known as: ARIMIDEX Take 1 mg by mouth daily.   CALCIUM 1200+D3 PO Take 1 tablet by mouth daily.   carbidopa-levodopa 50-200 MG tablet Commonly known as: SINEMET CR Take 1 tablet by mouth 2 (two) times daily. What changed: Another medication with the same name was removed. Continue taking this medication, and follow the directions you see here.   escitalopram  10 MG tablet Commonly known as: LEXAPRO Take 10 mg by mouth daily.   megestrol 40 MG/ML suspension Commonly known as: MEGACE Take 40 mg by mouth daily.   memantine 5 MG tablet Commonly known as: NAMENDA Take 5 mg by mouth daily.   modafinil 100 MG tablet Commonly known as: PROVIGIL Take 100 mg by mouth daily.   PRESERVISION AREDS 2 PO Take 1 tablet by mouth daily.   rivastigmine 4.6 mg/24hr Commonly known as: EXELON Place 4.6 mg onto the skin daily.   simvastatin 40 MG tablet Commonly known as: ZOCOR Take 40 mg by mouth at bedtime.      No Known Allergies  Contact information for after-discharge care    Destination    [facility] Preferred SNF .   Service: Skilled Nursing Contact information: [address] [place] [phone]                   The results of significant diagnostics from this hospitalization (including imaging, microbiology, ancillary and laboratory) are listed below for reference.    Significant Diagnostic Studies: DG Ribs Unilateral W/Chest Right  Result Date: 06/01/2020 CLINICAL DATA:  86 year old female with fall and right chest wall pain. EXAM: RIGHT RIBS AND CHEST - 3+ VIEW COMPARISON:  Chest radiograph dated 04/23/2020 FINDINGS: . the lungs are clear. There is no pleural effusion or pneumothorax. There is mild cardiomegaly. Multiple left lateral rib fractures as seen on the prior radiograph of 04/23/2020. No acute rib fracture identified on the right. IMPRESSION: 1. No acute cardiopulmonary process. 2. Multiple left lateral rib fractures. No acute right rib fractures. Electronically Signed   By: Denise Diaz M.D.   On: 06/01/2020 01:22   DG Lumbar Spine Complete  Result Date: 06/01/2020 CLINICAL DATA:  86 year old female with fall and back pain. EXAM: LUMBAR SPINE - COMPLETE 4+ VIEW COMPARISON:  None. FINDINGS: Five lumbar type vertebra. No acute fracture of the lumbar spine. There  is compression fracture of the superior endplate of Z61 with approximately 25% loss of vertebral body height, likely acute. Multilevel facet arthropathy. There is atherosclerotic calcification of the aorta. The soft tissues are grossly unremarkable. IMPRESSION: 1. No acute fracture of the lumbar spine. 2. Acute appearing compression fracture of the superior endplate of W96. Electronically Signed   By: Denise Diaz M.D.   On: 06/01/2020 01:24   CT Head Wo Contrast  Result Date: 06/12/2020 CLINICAL DATA:  Altered mental status. EXAM: CT HEAD WITHOUT CONTRAST TECHNIQUE: Contiguous axial images were obtained from the base of the skull through the vertex without intravenous contrast. COMPARISON:  June 01, 2020 FINDINGS: Brain: There is moderate severity cerebral atrophy with widening of the extra-axial spaces and ventricular dilatation. There are areas of decreased attenuation within the white matter tracts of the supratentorial brain, consistent with microvascular disease changes. Vascular: No hyperdense vessel or unexpected calcification. Skull: Normal. Negative for fracture or focal lesion. Sinuses/Orbits: No acute finding. Other: None. IMPRESSION: 1. Generalized cerebral atrophy. 2. No acute intracranial abnormality. Electronically Signed   By: Virgina Norfolk M.D.   On: 06/12/2020 18:02   CT HEAD WO CONTRAST  Result Date: 06/01/2020 CLINICAL DATA:  86 year old female status post unwitnessed fall. Headache. EXAM: CT HEAD WITHOUT CONTRAST TECHNIQUE: Contiguous axial images were obtained from the base of the skull through the vertex without intravenous contrast. COMPARISON:  Brain MRI 10/16/2017.  Head CT 08/08/2017. FINDINGS: Brain: Stable cerebral volume since 2018. No midline shift, ventriculomegaly, mass effect, evidence of mass lesion, intracranial hemorrhage or evidence of cortically based acute infarction. Gray-white matter differentiation remains normal for age. No cortical encephalomalacia  identified. Vascular: Mild Calcified atherosclerosis at the skull base. No suspicious intracranial vascular hyperdensity. Skull: No acute osseous abnormality identified. Sinuses/Orbits: Mild bilateral mastoid effusions since 2018. Negative visible nasopharynx. Tympanic cavities remain clear. Visualized paranasal sinuses are stable and well pneumatized. Other: Mild posterior scalp hematoma or contusion on the left (series 4, image 37). Underlying calvarium intact. No scalp soft tissue gas. Stable and negative orbits. IMPRESSION: 1. Mild posterior scalp soft tissue injury without underlying skull fracture. 2. Stable and negative for age non contrast CT appearance of the brain. 3. Mild bilateral mastoid effusions are new since 2018, likely postinflammatory. Electronically Signed   By: Denise Diaz M.D.   On: 06/01/2020 01:03   CT CERVICAL SPINE WO CONTRAST  Result Date: 06/01/2020 CLINICAL DATA:  86 year old female status post unwitnessed fall. Headache. EXAM: CT CERVICAL SPINE WITHOUT CONTRAST TECHNIQUE: Multidetector CT imaging of the cervical spine was performed without intravenous contrast. Multiplanar CT image reconstructions were also generated. COMPARISON:  Head CT today reported separately. FINDINGS: Alignment: Straightening of cervical lordosis with mild  degenerative appearing anterolisthesis at both C4-C5 and C7-T1. Bilateral posterior element alignment is within normal limits. Skull base and vertebrae: Visualized skull base is intact. No atlanto-occipital dissociation. C1 and C2 appear intact and normally aligned. No acute osseous abnormality identified. Soft tissues and spinal canal: No prevertebral fluid or swelling. No visible canal hematoma. Calcified cervical carotid atherosclerosis but otherwise negative noncontrast neck soft tissues. Disc levels: Overall mild for age cervical spine degeneration although there is multilevel moderate to severe facet arthropathy, on the left. Upper chest: Visible upper  thoracic levels appear intact. Negative lung apices. IMPRESSION: No acute traumatic injury identified in the cervical spine. Electronically Signed   By: Denise Diaz M.D.   On: 06/01/2020 01:06   MR BRAIN WO CONTRAST  Result Date: 06/16/2020 CLINICAL DATA:  86 year old female status post unwitnessed fall on 05/31/2020. Altered mental status. EXAM: MRI HEAD WITHOUT CONTRAST TECHNIQUE: Multiplanar, multiecho pulse sequences of the brain and surrounding structures were obtained without intravenous contrast. COMPARISON:  Head CT 06/12/2020.  Brain MRI 10/16/2017. FINDINGS: Brain: No restricted diffusion to suggest acute infarction. No midline shift, mass effect, evidence of mass lesion, ventriculomegaly, extra-axial collection or acute intracranial hemorrhage. Cervicomedullary junction and pituitary are within normal limits. Mild for age cerebral volume loss and scattered nonspecific white matter T2/FLAIR hyperintensity has not significantly changed since 2018. Similar mild T2 heterogeneity in the deep gray nuclei. No cortical encephalomalacia or chronic cerebral blood products. Vascular: Major intracranial vascular flow voids are stable since 2018. Skull and upper cervical spine: Negative for age visible cervical spine. Visualized bone marrow signal is within normal limits. Sinuses/Orbits: Stable, negative. Other: Bilateral mastoid effusions are new since 2018. Negative visible pharynx. Other internal auditory structures appear grossly normal. Normal stylomastoid foramina. Negative scalp and face soft tissues. IMPRESSION: 1. No acute intracranial abnormality. Largely unremarkable for age noncontrast MRI appearance of brain. 2. Bilateral mastoid effusions are new since 2018 and likely postinflammatory. Electronically Signed   By: Denise Diaz M.D.   On: 06/16/2020 16:39   DG Chest Portable 1 View  Result Date: 06/12/2020 CLINICAL DATA:  Altered mental status EXAM: PORTABLE CHEST 1 VIEW COMPARISON:  Radiographs 06/02/2019  FINDINGS: No consolidation, features of edema, pneumothorax, or effusion. The aorta is calcified. The remaining cardiomediastinal contours are unremarkable. No acute osseous or soft tissue abnormality. Stable appearance of several progressive healing left rib fractures. Degenerative changes are present in the imaged spine and shoulders. Telemetry leads overlie the chest. IMPRESSION: No acute cardiopulmonary abnormality. Continued healing of the left rib fractures. Aortic Atherosclerosis (ICD10-I70.0). Electronically Signed   By: Lovena Le M.D.   On: 06/12/2020 16:09    Microbiology: Recent Results (from the past 240 hour(s))  Urine culture     Status: Abnormal   Collection Time: 06/12/20  6:01 PM   Specimen: Urine, Catheterized  Result Value Ref Range Status   Specimen Description   Final    URINE, CATHETERIZED Performed at [facility] [address] [place], [place] [address]    Special Requests   Final    NONE Performed at [facility], [place] [address], [place], [place] [address]    Culture >=100,000 COLONIES/mL Denise Diaz (Denise Diaz)  Final   Report Status 06/14/2020 FINAL  Final   Organism ID, Bacteria Denise Diaz (Denise Diaz)  Final      Susceptibility   Denise Diaz - MIC*    AMPICILLIN >=32 RESISTANT Resistant     CEFAZOLIN 8 SENSITIVE Sensitive  CEFTRIAXONE <=0.25 SENSITIVE Sensitive     CIPROFLOXACIN <=0.25 SENSITIVE Sensitive     GENTAMICIN >=16 RESISTANT Resistant     IMIPENEM 2 SENSITIVE Sensitive     NITROFURANTOIN 128 RESISTANT Resistant     TRIMETH/SULFA <=20 SENSITIVE Sensitive     AMPICILLIN/SULBACTAM 16 INTERMEDIATE Intermediate     PIP/TAZO <=4 SENSITIVE Sensitive     * >=100,000 COLONIES/mL Denise Diaz  SARS Coronavirus 2 by RT PCR (hospital order, performed in [place] hospital lab) Nasopharyngeal Nasopharyngeal Swab     Status: None   Collection Time: 06/12/20  8:47 PM   Specimen: Nasopharyngeal Swab   Result Value Ref Range Status   SARS Coronavirus 2 NEGATIVE NEGATIVE Final    Comment: (NOTE) SARS-CoV-2 target nucleic acids are NOT DETECTED.  The SARS-CoV-2 RNA is generally detectable in upper and lower respiratory specimens during the acute phase of infection. The lowest concentration of SARS-CoV-2 viral copies this assay can detect is 250 copies / mL. Denise Diaz negative result does not preclude SARS-CoV-2 infection and should not be used as the sole basis for treatment or other patient management decisions.  Denise Diaz negative result may occur with improper specimen collection / handling, submission of specimen other than nasopharyngeal swab, presence of viral mutation(s) within the areas targeted by this assay, and inadequate number of viral copies (<250 copies / mL). Karishma Unrein negative result must be combined with clinical observations, patient history, and epidemiological information.  Fact Sheet for Patients:   StrictlyIdeas.no  Fact Sheet for Healthcare Providers: BankingDealers.co.za  This test is not yet approved or  cleared by the [place] FDA and has been authorized for detection and/or diagnosis of SARS-CoV-2 by FDA under an Emergency Use Authorization (EUA).  This EUA will remain in effect (meaning this test can be used) for the duration of the COVID-19 declaration under Section 564(b)(1) of the Act, 21 U.S.C. section 360bbb-3(b)(1), unless the authorization is terminated or revoked sooner.  Performed at [facility], [place] [address] [place], [place] [address]   SARS Coronavirus 2 by RT PCR (hospital order, performed in [facility] hospital lab) Nasopharyngeal Nasopharyngeal Swab     Status: None   Collection Time: 06/17/20  4:50 PM   Specimen: Nasopharyngeal Swab  Result Value Ref Range Status   SARS Coronavirus 2 NEGATIVE NEGATIVE Final    Comment: (NOTE) SARS-CoV-2 target nucleic acids are NOT DETECTED.  The  SARS-CoV-2 RNA is generally detectable in upper and lower respiratory specimens during the acute phase of infection. The lowest concentration of SARS-CoV-2 viral copies this assay can detect is 250 copies / mL. Denise Diaz negative result does not preclude SARS-CoV-2 infection and should not be used as the sole basis for treatment or other patient management decisions.  Denise Diaz negative result may occur with improper specimen collection / handling, submission of specimen other than nasopharyngeal swab, presence of viral mutation(s) within the areas targeted by this assay, and inadequate number of viral copies (<250 copies / mL). Denise Diaz negative result must be combined with clinical observations, patient history, and epidemiological information.  Fact Sheet for Patients:   StrictlyIdeas.no  Fact Sheet for Healthcare Providers: BankingDealers.co.za  This test is not yet approved or  cleared by the [place] FDA and has been authorized for detection and/or diagnosis of SARS-CoV-2 by FDA under an Emergency Use Authorization (EUA).  This EUA will remain in effect (meaning this test can be used) for the duration of the COVID-19 declaration under Section 564(b)(1) of the Act, 21 U.S.C.  section 360bbb-3(b)(1), unless the authorization is terminated or revoked sooner.  Performed at [facility], [place] [address], [place], [place] [address]      Labs: Basic Metabolic Panel: Recent Labs  Lab 06/14/20 0335 06/15/20 0422 06/17/20 0241 06/18/20 0229 06/19/20 0238  NA 140 140 140 140 138  K 3.8 3.5 3.5 3.7 3.6  CL 108 106 106 106 106  CO2 24 25 25 25 24   GLUCOSE 78 100* 93 89 100*  BUN 16 16 12 18 16   CREATININE 0.78 0.68 0.59 0.61 0.66  CALCIUM 8.7* 8.7* 8.7* 8.8* 8.6*  MG  --   --   --  2.2 2.3  PHOS  --   --   --  2.4* 2.2*   Liver Function Tests: Recent Labs  Lab 06/18/20 0229 06/19/20 0238  AST 43* 34  ALT 7 6  ALKPHOS 63  67  BILITOT 0.3 0.6  PROT 6.1* 6.2*  ALBUMIN 3.0* 3.2*   No results for input(s): LIPASE, AMYLASE in the last 168 hours. Recent Labs  Lab 06/14/20 1205  AMMONIA 12   CBC: Recent Labs  Lab 06/12/20 1615 06/13/20 0342 06/14/20 0335 06/15/20 0422 06/17/20 0241 06/18/20 0229 06/19/20 0238  WBC 11.3*   < > 7.7 9.5 6.5 7.8 7.4  NEUTROABS 8.7*  --   --   --   --  4.7 4.1  HGB 14.8   < > 13.1 12.6 12.2 12.7 13.0  HCT 46.6*   < > 42.5 39.6 38.3 39.7 40.1  MCV 94.3   < > 96.8 93.8 94.1 93.6 91.8  PLT 452*   < > 419* 409* 339 377 345   < > = values in this interval not displayed.   Cardiac Enzymes: No results for input(s): CKTOTAL, CKMB, CKMBINDEX, TROPONINI in the last 168 hours. BNP: BNP (last 3 results) No results for input(s): BNP in the last 8760 hours.  ProBNP (last 3 results) No results for input(s): PROBNP in the last 8760 hours.  CBG: Recent Labs  Lab 06/13/20 1025  GLUCAP 77       Signed:  Fayrene Helper MD.  [facility] 06/19/2020, 2:31 PM

## 2020-06-19 NOTE — TOC Transition Note (Signed)
Transition of Care [facility]) - CM/SW Discharge Note   Patient Details  Name: Denise Diaz MRN: [id number] Date of Birth: 1928/09/05  Transition [facility] Care [facility]) CM/SW Contact:  Lia Hopping, [place] Phone Number: 06/19/2020, 1:13 PM   Clinical Narrative:    Everlene Balls Authorization# [id number] approved for 5 day 06/18/2020, next review date 8/2. Details provided to SNF.  Facility is requesting the patient daughter present vaccine card or records indicating the patient has been vaccinated. Daughter is actively searching for card.  CSW competed chart review in epic for immunization record /reached out to the admission nurse for guidance. Unable to  Locate vaccine record in epic.  Room:301A. PTAR to transport.    Final next level of care: Skilled Nursing Facility Barriers to Discharge: No Barriers Identified   Patient Goals and CMS Choice Patient states their goals for this hospitalization and ongoing recovery are:: Per daughter, go to rehab CMS Medicare.gov Compare Post Acute Care list provided to:: Other (Comment Required) (Daughter) Choice offered to / list presented to : Adult Children  Discharge Placement                       Discharge Plan and Services   Discharge Planning Services: NA Post Acute Care Choice: Skilled Nursing Facility          DME Arranged: N/A DME Agency: NA       HH Arranged: NA HH Agency: NA        Social Determinants of Health (SDOH) Interventions     Readmission Risk Interventions No flowsheet data found.

## 2020-06-19 NOTE — TOC Progression Note (Addendum)
Transition of Care [facility]) - Progression Note    Patient Details  Name: Zahava Quant MRN: [id number] Date of Birth: Aug 13, 1928  Transition of Care [facility]) CM/SW [place], [place] Phone Number: 06/19/2020, 11:49 AM  Clinical Narrative:    CSW reached out to [facility] to determine their bed status again. Per [facility], the facility gave away the bed for the Saturday admission. Daughter inquired about [facility]. The facility will accept the patient. Patient daughter says the facility is too far. Patient daughter revisited snf list and chose [facility]. [facility] will not have a bed available until next week.  Patient daughter given all SNF options.  Patient daughter has requested to take the patient home with [facility].    Patient daughter and son are agreeable to [facility] and rehab       Expected Discharge Plan: Skilled Nursing Facility Barriers to Discharge: Continued Medical Work up  Expected Discharge Plan and Services Expected Discharge Plan: [place]   Discharge Planning Services: NA Post Acute Care Choice: [place] Living arrangements for the past 2 months: Single Family Home                 DME Arranged: N/A DME Agency: NA       HH Arranged: NA HH Agency: NA         Social Determinants of Health (SDOH) Interventions    Readmission Risk Interventions No flowsheet data found.

## 2020-06-19 NOTE — Care Management Important Message (Signed)
Important Message  Patient Details IM Letter given to the Patient Name: Denise Diaz MRN: [id number] Date of Birth: 07/01/1928   Medicare Important Message Given:  Yes     Kerin Salen 06/19/2020, 1:27 PM

## 2020-06-19 NOTE — Plan of Care (Signed)
Plan of care reviewed and discussed with daughter.

## 2020-06-22 ENCOUNTER — Encounter: Payer: Self-pay | Admitting: Adult Health

## 2020-06-22 ENCOUNTER — Non-Acute Institutional Stay (SKILLED_NURSING_FACILITY): Payer: Medicare Other | Admitting: Adult Health

## 2020-06-22 DIAGNOSIS — G47419 Narcolepsy without cataplexy: Secondary | ICD-10-CM

## 2020-06-22 DIAGNOSIS — N3000 Acute cystitis without hematuria: Secondary | ICD-10-CM | POA: Diagnosis not present

## 2020-06-22 DIAGNOSIS — Z853 Personal history of malignant neoplasm of breast: Secondary | ICD-10-CM | POA: Diagnosis not present

## 2020-06-22 DIAGNOSIS — G20 Parkinson's disease: Secondary | ICD-10-CM | POA: Diagnosis not present

## 2020-06-22 DIAGNOSIS — F0280 Dementia in other diseases classified elsewhere without behavioral disturbance: Secondary | ICD-10-CM

## 2020-06-22 DIAGNOSIS — S22080S Wedge compression fracture of T11-T12 vertebra, sequela: Secondary | ICD-10-CM

## 2020-06-22 DIAGNOSIS — F339 Major depressive disorder, recurrent, unspecified: Secondary | ICD-10-CM | POA: Diagnosis not present

## 2020-06-22 DIAGNOSIS — R627 Adult failure to thrive: Secondary | ICD-10-CM

## 2020-06-22 NOTE — Progress Notes (Signed)
Location:  [place] Room Number: 782-N Place of Service:  SNF (31) Provider:  Durenda Age, DNP, FNP-BC  Patient Care Team: Denise Anda, MD as PCP - General (Internal Medicine)  Extended Emergency Contact Information Primary Emergency Contact: Denise, Diaz Phone: [phone] Relation: Son Secondary Emergency Contact: Diaz,Denise Mobile Phone: [phone] Relation: Daughter  Code Status:  DNR  Goals of care: Advanced Directive information Advanced Directives 06/18/2020  Does Patient Have a Medical Advance Directive? -  Type of Paramedic of [place];Living will  Does patient want to make changes to medical advance directive? -  Copy of [place] in Chart? -  Would patient like information on creating a medical advance directive? -     Chief Complaint  Patient presents with  . Acute Visit    Patient is seen for hospital followup, status post hospitalization at [facility] 7/23-7/30/21 for a urinary tract infection    HPI:  Pt is a 86 y.o. female seen today for hospital follow-up.  She was admitted to [place] for short-term rehabilitation following an admission to [facility] 7/23 to 06/19/2020 for UTI.  She has a PMH of history of breast cancer, narcolepsy, dementia and Parkinson's disease.  Two weeks prior to hospitalization, in addition to ongoing cognitive and functional decline over several months, she was noted to have progressive decline.  Work-up in the ED showed mild leukocytosis, UA with greater than 50 WBCs, positive nitrite, CT head noted advanced atrophy, chest x-ray was unremarkable and other electrolytes were within normal limits.  She was given IV fluids along with ceftriaxone for UTI concern.  Her mentation slowly improved, less lethargic as days went by.  On 7/27, she was noted to have garbled, nonsensical speech, worse than previously.  MRI negative for stroke.  She  was lethargic on 7/28 and was thought to be secondary to benzo, which is now improved.  She was seen in the room today. She was needing extensive assistance in getting up to the wheelchair.   Past Medical History:  Diagnosis Date  . Cancer ([place])    hx breast cancer treated with meds  . Dementia [facility])    Past Surgical History:  Procedure Laterality Date  . melanoma removal      No Known Allergies  Outpatient Encounter Medications as of 06/22/2020  Medication Sig  . anastrozole (ARIMIDEX) 1 MG tablet Take 1 mg by mouth daily.  . Calcium-Magnesium-Vitamin D (CALCIUM 1200+D3 PO) Take 1 tablet by mouth daily.  . carbidopa-levodopa (SINEMET CR) 50-200 MG tablet Take 1 tablet by mouth 2 (two) times daily.  Marland Kitchen escitalopram (LEXAPRO) 10 MG tablet Take 10 mg by mouth daily.  . megestrol (MEGACE) 40 MG/ML suspension Take 40 mg by mouth daily.   . memantine (NAMENDA) 5 MG tablet Take 5 mg by mouth daily.   . modafinil (PROVIGIL) 100 MG tablet Take 100 mg by mouth daily.   . Multiple Vitamins-Minerals (PRESERVISION AREDS 2 PO) Take 1 tablet by mouth daily.  . rivastigmine (EXELON) 4.6 mg/24hr Place 4.6 mg onto the skin daily.   . simvastatin (ZOCOR) 40 MG tablet Take 40 mg by mouth at bedtime.   No facility-administered encounter medications on file as of 06/22/2020.    Review of Systems  Unable to obtain due to dementia    Immunization History  Administered Date(s) Administered  . Influenza-Unspecified 08/18/2015, 09/27/2018  . PFIZER SARS-COV-2 Vaccination 03/03/2020, 03/24/2020  . PPD Test 01/03/2005, 01/10/2005  .  Pneumococcal Conjugate-13 11/28/2014  . Pneumococcal Polysaccharide-23 02/01/2001, 11/02/2006  . Td 09/16/2011   Pertinent  Health Maintenance Due  Topic Date Due  . DEXA SCAN  Never done  . INFLUENZA VACCINE  06/21/2020  . PNA vac Low Risk Adult  Completed    Vitals:   06/22/20 1256  BP: 135/72  Pulse: 75  Resp: 19  Temp: (!) 97.5 F (36.4 C)  TempSrc: Oral    Weight: 145 lb (65.8 kg)  Height: 5\' 6"  (1.676 m)   Body mass index is 23.4 kg/m.  Physical Exam  GENERAL APPEARANCE:  In no acute distress.  SKIN:  Skin is warm and dry.  MOUTH and THROAT: Lips are without lesions. Oral mucosa is moist and without lesions. Tongue is normal in shape, size, and color and without lesions RESPIRATORY: Breathing is even & unlabored, BS CTAB CARDIAC: RRR, no murmur,no extra heart sounds, no edema GI: Abdomen soft, normal BS, no masses, no tenderness EXTREMITIES:  Able to move X 4 extremities NEUROLOGICAL: There is no tremor. Speech is clear. Alert to self, disoriented to time and place. PSYCHIATRIC:  Affect and behavior are appropriate  Labs reviewed: Recent Labs    06/17/20 0241 06/18/20 0229 06/19/20 0238  NA 140 140 138  K 3.5 3.7 3.6  CL 106 106 106  CO2 25 25 24   GLUCOSE 93 89 100*  BUN 12 18 16   CREATININE 0.59 0.61 0.66  CALCIUM 8.7* 8.8* 8.6*  MG  --  2.2 2.3  PHOS  --  2.4* 2.2*   Recent Labs    06/18/20 0229 06/19/20 0238  AST 43* 34  ALT 7 6  ALKPHOS 63 67  BILITOT 0.3 0.6  PROT 6.1* 6.2*  ALBUMIN 3.0* 3.2*   Recent Labs    06/12/20 1615 06/13/20 0342 06/17/20 0241 06/18/20 0229 06/19/20 0238  WBC 11.3*   < > 6.5 7.8 7.4  NEUTROABS 8.7*  --   --  4.7 4.1  HGB 14.8   < > 12.2 12.7 13.0  HCT 46.6*   < > 38.3 39.7 40.1  MCV 94.3   < > 94.1 93.6 91.8  PLT 452*   < > 339 377 345   < > = values in this interval not displayed.   Lab Results  Component Value Date   TSH 2.377 06/14/2020    Significant Diagnostic Results in last 30 days:  DG Ribs Unilateral W/Chest Right  Result Date: 06/01/2020 CLINICAL DATA:  86 year old female with fall and right chest wall pain. EXAM: RIGHT RIBS AND CHEST - 3+ VIEW COMPARISON:  Chest radiograph dated 04/23/2020 FINDINGS: . the lungs are clear. There is no pleural effusion or pneumothorax. There is mild cardiomegaly. Multiple left lateral rib fractures as seen on the prior  radiograph of 04/23/2020. No acute rib fracture identified on the right. IMPRESSION: 1. No acute cardiopulmonary process. 2. Multiple left lateral rib fractures. No acute right rib fractures. Electronically Signed   By: Anner Crete M.D.   On: 06/01/2020 01:22   DG Lumbar Spine Complete  Result Date: 06/01/2020 CLINICAL DATA:  86 year old female with fall and back pain. EXAM: LUMBAR SPINE - COMPLETE 4+ VIEW COMPARISON:  None. FINDINGS: Five lumbar type vertebra. No acute fracture of the lumbar spine. There is compression fracture of the superior endplate of V37 with approximately 25% loss of vertebral body height, likely acute. Multilevel facet arthropathy. There is atherosclerotic calcification of the aorta. The soft tissues are grossly unremarkable. IMPRESSION: 1. No acute  fracture of the lumbar spine. 2. Acute appearing compression fracture of the superior endplate of E33. Electronically Signed   By: Anner Crete M.D.   On: 06/01/2020 01:24   CT Head Wo Contrast  Result Date: 06/12/2020 CLINICAL DATA:  Altered mental status. EXAM: CT HEAD WITHOUT CONTRAST TECHNIQUE: Contiguous axial images were obtained from the base of the skull through the vertex without intravenous contrast. COMPARISON:  June 01, 2020 FINDINGS: Brain: There is moderate severity cerebral atrophy with widening of the extra-axial spaces and ventricular dilatation. There are areas of decreased attenuation within the white matter tracts of the supratentorial brain, consistent with microvascular disease changes. Vascular: No hyperdense vessel or unexpected calcification. Skull: Normal. Negative for fracture or focal lesion. Sinuses/Orbits: No acute finding. Other: None. IMPRESSION: 1. Generalized cerebral atrophy. 2. No acute intracranial abnormality. Electronically Signed   By: Virgina Norfolk M.D.   On: 06/12/2020 18:02   CT HEAD WO CONTRAST  Result Date: 06/01/2020 CLINICAL DATA:  86 year old female status post unwitnessed  fall. Headache. EXAM: CT HEAD WITHOUT CONTRAST TECHNIQUE: Contiguous axial images were obtained from the base of the skull through the vertex without intravenous contrast. COMPARISON:  Brain MRI 10/16/2017.  Head CT 08/08/2017. FINDINGS: Brain: Stable cerebral volume since 2018. No midline shift, ventriculomegaly, mass effect, evidence of mass lesion, intracranial hemorrhage or evidence of cortically based acute infarction. Gray-white matter differentiation remains normal for age. No cortical encephalomalacia identified. Vascular: Mild Calcified atherosclerosis at the skull base. No suspicious intracranial vascular hyperdensity. Skull: No acute osseous abnormality identified. Sinuses/Orbits: Mild bilateral mastoid effusions since 2018. Negative visible nasopharynx. Tympanic cavities remain clear. Visualized paranasal sinuses are stable and well pneumatized. Other: Mild posterior scalp hematoma or contusion on the left (series 4, image 37). Underlying calvarium intact. No scalp soft tissue gas. Stable and negative orbits. IMPRESSION: 1. Mild posterior scalp soft tissue injury without underlying skull fracture. 2. Stable and negative for age non contrast CT appearance of the brain. 3. Mild bilateral mastoid effusions are new since 2018, likely postinflammatory. Electronically Signed   By: Genevie Ann M.D.   On: 06/01/2020 01:03   CT CERVICAL SPINE WO CONTRAST  Result Date: 06/01/2020 CLINICAL DATA:  86 year old female status post unwitnessed fall. Headache. EXAM: CT CERVICAL SPINE WITHOUT CONTRAST TECHNIQUE: Multidetector CT imaging of the cervical spine was performed without intravenous contrast. Multiplanar CT image reconstructions were also generated. COMPARISON:  Head CT today reported separately. FINDINGS: Alignment: Straightening of cervical lordosis with mild degenerative appearing anterolisthesis at both C4-C5 and C7-T1. Bilateral posterior element alignment is within normal limits. Skull base and vertebrae:  Visualized skull base is intact. No atlanto-occipital dissociation. C1 and C2 appear intact and normally aligned. No acute osseous abnormality identified. Soft tissues and spinal canal: No prevertebral fluid or swelling. No visible canal hematoma. Calcified cervical carotid atherosclerosis but otherwise negative noncontrast neck soft tissues. Disc levels: Overall mild for age cervical spine degeneration although there is multilevel moderate to severe facet arthropathy, on the left. Upper chest: Visible upper thoracic levels appear intact. Negative lung apices. IMPRESSION: No acute traumatic injury identified in the cervical spine. Electronically Signed   By: Genevie Ann M.D.   On: 06/01/2020 01:06   MR BRAIN WO CONTRAST  Result Date: 06/16/2020 CLINICAL DATA:  86 year old female status post unwitnessed fall on 05/31/2020. Altered mental status. EXAM: MRI HEAD WITHOUT CONTRAST TECHNIQUE: Multiplanar, multiecho pulse sequences of the brain and surrounding structures were obtained without intravenous contrast. COMPARISON:  Head CT 06/12/2020.  Brain  MRI 10/16/2017. FINDINGS: Brain: No restricted diffusion to suggest acute infarction. No midline shift, mass effect, evidence of mass lesion, ventriculomegaly, extra-axial collection or acute intracranial hemorrhage. Cervicomedullary junction and pituitary are within normal limits. Mild for age cerebral volume loss and scattered nonspecific white matter T2/FLAIR hyperintensity has not significantly changed since 2018. Similar mild T2 heterogeneity in the deep gray nuclei. No cortical encephalomalacia or chronic cerebral blood products. Vascular: Major intracranial vascular flow voids are stable since 2018. Skull and upper cervical spine: Negative for age visible cervical spine. Visualized bone marrow signal is within normal limits. Sinuses/Orbits: Stable, negative. Other: Bilateral mastoid effusions are new since 2018. Negative visible pharynx. Other internal auditory  structures appear grossly normal. Normal stylomastoid foramina. Negative scalp and face soft tissues. IMPRESSION: 1. No acute intracranial abnormality. Largely unremarkable for age noncontrast MRI appearance of brain. 2. Bilateral mastoid effusions are new since 2018 and likely postinflammatory. Electronically Signed   By: Genevie Ann M.D.   On: 06/16/2020 16:39   DG Chest Portable 1 View  Result Date: 06/12/2020 CLINICAL DATA:  Altered mental status EXAM: PORTABLE CHEST 1 VIEW COMPARISON:  Radiographs 06/02/2019 FINDINGS: No consolidation, features of edema, pneumothorax, or effusion. The aorta is calcified. The remaining cardiomediastinal contours are unremarkable. No acute osseous or soft tissue abnormality. Stable appearance of several progressive healing left rib fractures. Degenerative changes are present in the imaged spine and shoulders. Telemetry leads overlie the chest. IMPRESSION: No acute cardiopulmonary abnormality. Continued healing of the left rib fractures. Aortic Atherosclerosis (ICD10-I70.0). Electronically Signed   By: Lovena Le M.D.   On: 06/12/2020 16:09    Assessment/Plan  1. Acute cystitis without hematuria -Was started on IV ceftriaxone, urine culture grew Proteus mirabilis, changed to Ancef for a total of 7 days  2. History of breast cancer -Continue anastrozole  3. Major depression, recurrent, chronic (HCC) -Stable, continue Escitalopram   4. Primary narcolepsy without cataplexy -Continue modafinil  5. Adult failure to thrive Wt Readings from Last 3 Encounters:  06/22/20 145 lb (65.8 kg)  06/17/20 145 lb (65.8 kg)   -Continue megestrol to increase appetite  6.  Dementia due to Parkinson's disease without behavioral disturbance  -Continue Namenda, Exelon patch and carbidopa-levodopa -Fall precautions  7.  Compression fracture of T12 vertebra sequela -For PT and OT, for therapeutic and strengthening exercises -Fall precautions   Family/ staff Communication:  Discussed plan of care with resident and charge nurse.  Labs/tests ordered:  None  Goals of care:   Short-term care  Durenda Age, DNP, MSN, FNP-BC [facility] [phone] (Monday-Friday 8:00 a.m. - 5:00 p.m.) [phone] (after hours)

## 2020-06-23 ENCOUNTER — Encounter: Payer: Self-pay | Admitting: Internal Medicine

## 2020-06-23 ENCOUNTER — Non-Acute Institutional Stay (SKILLED_NURSING_FACILITY): Payer: Medicare Other | Admitting: Internal Medicine

## 2020-06-23 DIAGNOSIS — R29818 Other symptoms and signs involving the nervous system: Secondary | ICD-10-CM | POA: Insufficient documentation

## 2020-06-23 DIAGNOSIS — N3000 Acute cystitis without hematuria: Secondary | ICD-10-CM | POA: Diagnosis not present

## 2020-06-23 DIAGNOSIS — R627 Adult failure to thrive: Secondary | ICD-10-CM | POA: Insufficient documentation

## 2020-06-23 DIAGNOSIS — R4189 Other symptoms and signs involving cognitive functions and awareness: Secondary | ICD-10-CM

## 2020-06-23 DIAGNOSIS — R410 Disorientation, unspecified: Secondary | ICD-10-CM | POA: Diagnosis not present

## 2020-06-23 DIAGNOSIS — R4182 Altered mental status, unspecified: Secondary | ICD-10-CM | POA: Insufficient documentation

## 2020-06-23 NOTE — Assessment & Plan Note (Signed)
Remains afebrile with no clinical signs of recurrent UTI.

## 2020-06-23 NOTE — Assessment & Plan Note (Addendum)
Avoid potential drugs on the [place] list which would exacerbate or cause recurrence of AMS

## 2020-06-23 NOTE — Assessment & Plan Note (Signed)
If patient returns home; she will require 24/7 supervision most likely.

## 2020-06-23 NOTE — Progress Notes (Signed)
NURSING HOME LOCATION:  [facility] ROOM NUMBER:  [id number]  CODE STATUS:  DNR  PCP:  Rudene Anda, MD  [address] [place] [address]  This is a comprehensive admission note to [facility] performed on this date less than 30 days from date of admission. Included are preadmission medical/surgical history; reconciled medication list; family history; social history and comprehensive review of systems.  Corrections and additions to the records were documented. Comprehensive physical exam was also performed. Additionally a clinical summary was entered for each active diagnosis pertinent to this admission in the Problem List to enhance continuity of care.  HPI: Patient was hospitalized 7/23-7/30/2021 presenting to the ED with progressive failure to thrive with accelerated decline over the prior 2 weeks manifested as cognitive and functional deficits. UA revealed greater than 50 WBCs, positive nitrate.  Ceftriaxone was initiated empirically for UTI.  C&S confirmed Proteus mirabilis UTI.  Ceftriaxone was transitioned to Ancef with a total course of 7 days. CT of head revealed advanced atrophy. With rehydration and antibiotics mentation slowly improved and lethargy decreased; but on 7/27 she was noted to have garbled, nonsensical speech.  MRI was negative for stroke. She was very lethargic on 7/28 thought to be related to benzodiazepine.  Ammonia level was normal as was TSH.  B12 and VDRL were not assessed.  Delirium precautions were initiated. Accelerated hypertension was intermittently present with blood pressures up to 599 systolic. Speech therapy recommended dysphagia 3 diet with thin liquids. At discharge repeat CBC and CMP were recommended.  Palliative care follow-up as an outpatient was recommended.  Past medical and surgical history: Includes history of breast cancer, history of melanoma, Parkinson's disease, essential hypertension and dementia.  History also  includes recent T12 vertebral compression fracture.  Social history: Currently nondrinker; never smoked.  Family history: Limited history reviewed. FH is noncontributory due to her advanced age.   Review of systems:  Could not be completed due to dementia.  She scored 2 out of 15 on the BIMS, brief mental status test, indicating severe dementia.  She could provide no focused responses to queries. She had difficulty following commands.  Physical exam:  Pertinent or positive findings: She is thin and appears suboptimally nourished.  She does have atrophy of the extremities, especially the upper extremities.  Eyebrows are thin.  Facies tend to be blank except when addressed and she looks somewhat puzzled.  She apparently has 2 hearing aids but only one is being worn at this time.  The left nasolabial fold is decreased compared to the right.  She retains cracker fragments in her mouth from a snack she has been eating.  First heart sound is slightly increased.  Trace edema is noted at the ankles.  Pedal pulses are decreased.  Clubbing of the nailbeds is suggested.  General appearance:  no acute distress, increased work of breathing is present.   Lymphatic: No lymphadenopathy about the head, neck, axilla. Eyes: No conjunctival inflammation or lid edema is present. There is no scleral icterus. Ears:  External ear exam shows no significant lesions or deformities.   Nose:  External nasal examination shows no deformity or inflammation. Nasal mucosa are pink and moist without lesions, exudates Oral exam: Lips and gums are healthy appearing.There is no oropharyngeal erythema or exudate. Neck:  No thyromegaly, masses, tenderness noted.    Heart:  Normal rate and regular rhythm. S2 normal without gallop, murmur, click, rub.  Lungs: Chest clear to auscultation without wheezes, rhonchi,  rales, rubs. Abdomen: Bowel sounds are normal.  Abdomen is soft and nontender with no organomegaly, hernias, masses. GU:  Deferred  Extremities:  No cyanosis Neurologic exam:  Balance, Rhomberg, finger to nose testing could not be completed due to clinical state Skin: Warm & dry w/o tenting. No significant lesions or rash.  See clinical summary under each active problem in the Problem List with associated updated therapeutic plan

## 2020-06-23 NOTE — Assessment & Plan Note (Addendum)
Palliative care was recommended post discharge from the hospital Nutritionist consult with protein supplementation.

## 2020-06-23 NOTE — Patient Instructions (Signed)
See assessment and plan under each diagnosis in the problem list and acutely for this visit 

## 2020-07-02 ENCOUNTER — Non-Acute Institutional Stay (SKILLED_NURSING_FACILITY): Payer: Medicare Other | Admitting: Adult Health

## 2020-07-02 ENCOUNTER — Encounter: Payer: Self-pay | Admitting: Adult Health

## 2020-07-02 DIAGNOSIS — Z853 Personal history of malignant neoplasm of breast: Secondary | ICD-10-CM

## 2020-07-02 DIAGNOSIS — N3000 Acute cystitis without hematuria: Secondary | ICD-10-CM

## 2020-07-02 DIAGNOSIS — G47419 Narcolepsy without cataplexy: Secondary | ICD-10-CM

## 2020-07-02 DIAGNOSIS — F339 Major depressive disorder, recurrent, unspecified: Secondary | ICD-10-CM

## 2020-07-02 DIAGNOSIS — G20 Parkinson's disease: Secondary | ICD-10-CM | POA: Diagnosis not present

## 2020-07-02 DIAGNOSIS — R627 Adult failure to thrive: Secondary | ICD-10-CM

## 2020-07-02 DIAGNOSIS — F0280 Dementia in other diseases classified elsewhere without behavioral disturbance: Secondary | ICD-10-CM

## 2020-07-02 NOTE — Progress Notes (Signed)
Location:  [place] Room Number: 211-A Place of Service:  SNF (31) Provider:  Durenda Age, DNP, FNP-BC  Patient Care Team: Rudene Anda, MD as PCP - General (Internal Medicine)  Extended Emergency Contact Information Primary Emergency Contact: [place], [place] Phone: [phone] Relation: Son Secondary Emergency Contact: Kincade,[name] [place] Phone: [phone] Relation: Daughter  Code Status:  DNR  Goals of care: Advanced Directive information Advanced Directives 07/02/2020  Does Patient Have a Medical Advance Directive? Yes  Type of Advance Directive Living will  Does patient want to make changes to medical advance directive? No - Patient declined  Copy of [place] in Chart? No - copy requested  Would patient like information on creating a medical advance directive? -  Pre-existing out of facility DNR order (yellow form or pink MOST form) -     Chief Complaint  Patient presents with  . Discharge Note    Patient is seen for discharge home with hospice care.     HPI:  Pt is a 86 y.o. female who is for discharge home on 07/05/20 with hospice care.  She was admitted to [facility] on 06/19/2020 post [facility] hospitalization 06/12/2020 to 06/19/2020 for UTI.  She has a PMH of history of breast cancer, narcolepsy, dementia and Parkinson's disease.  Two weeks prior to hospitalization, in addition to ongoing cognitive and functional decline over several months, she was noted to have progressive decline. Work-up in the ED showed mild leukocytosis, UA with greater than 50 WBCs, positive nitrite, CT head noted advanced atrophy, chest x-ray was unremarkable and other electrolytes were within normal limits. She was given IV fluids along with ceftriaxone for UTI concern. Her mentation slowly improved, less lethargic as days went by. On 7/27, she was noted to have a garbled nonsensical speech, worse than previously.  MRI was negative for stroke. She was lethargic on 7/28 and was thought to be secondary to benzo, which is now improved.  Patient was admitted to this facility for short-term rehabilitation after the patient's recent hospitalization.  Patient has completed SNF rehabilitation and now discharging home with hospice care.   Past Medical History:  Diagnosis Date  . Cancer ([place])    hx breast cancer treated with meds  . Dementia [facility])    Past Surgical History:  Procedure Laterality Date  . melanoma removal      No Known Allergies  Outpatient Encounter Medications as of 07/02/2020  Medication Sig  . acetaminophen (TYLENOL) 325 MG tablet Take 650 mg by mouth every 6 (six) hours as needed.  Marland Kitchen anastrozole (ARIMIDEX) 1 MG tablet Take 1 mg by mouth daily.  . Calcium-Magnesium-Vitamin D (CALCIUM 1200+D3 PO) Take 1 tablet by mouth daily.  . carbidopa-levodopa (SINEMET CR) 50-200 MG tablet Take 1 tablet by mouth 2 (two) times daily.  Marland Kitchen escitalopram (LEXAPRO) 10 MG tablet Take 10 mg by mouth daily.  . megestrol (MEGACE) 40 MG/ML suspension Take 40 mg by mouth daily.   . memantine (NAMENDA) 5 MG tablet Take 5 mg by mouth daily.   . modafinil (PROVIGIL) 100 MG tablet Take 100 mg by mouth daily.   . Multiple Vitamins-Minerals (PRESERVISION AREDS 2 PO) Take 1 tablet by mouth daily.  . rivastigmine (EXELON) 4.6 mg/24hr Place 4.6 mg onto the skin daily.   . simvastatin (ZOCOR) 40 MG tablet Take 40 mg by mouth at bedtime.   No facility-administered encounter medications on file as of 07/02/2020.    Review of Systems unable  to obtain due to dementia    Immunization History  Administered Date(s) Administered  . Influenza,inj,Quad PF,6+ Mos 08/02/2016  . Influenza-Unspecified 08/18/2015, 09/27/2018  . PFIZER SARS-COV-2 Vaccination 03/03/2020, 03/24/2020  . PPD Test 01/10/2005, 01/10/2005  . Pneumococcal Conjugate-13 11/28/2014  . Pneumococcal Polysaccharide-23 02/01/2001, 11/02/2006  . Td 09/16/2011    Pertinent  Health Maintenance Due  Topic Date Due  . DEXA SCAN  Never done  . INFLUENZA VACCINE  06/21/2020  . PNA vac Low Risk Adult  Completed   No flowsheet data found.   Vitals:   07/02/20 1441  BP: 124/76  Pulse: 62  Resp: 18  Temp: (!) 97.3 F (36.3 C)  TempSrc: Oral  Weight: 137 lb 12.8 oz (62.5 kg)  Height: 5\' 6"  (1.676 m)   Body mass index is 22.24 kg/m.  Physical Exam  GENERAL APPEARANCE: Well nourished. In no acute distress. Normal body habitus SKIN:  Skin is warm and dry.  MOUTH and THROAT: Lips are without lesions. Oral mucosa is moist and without lesions.  RESPIRATORY: Breathing is even & unlabored, BS CTAB CARDIAC: RRR, no murmur,no extra heart sounds, no edema GI: Abdomen soft, normal BS, no masses, no tenderness NEUROLOGICAL: There is no tremor. Sleepy. PSYCHIATRIC:  Affect and behavior are appropriate  Labs reviewed: Recent Labs    06/17/20 0241 06/18/20 0229 06/19/20 0238  NA 140 140 138  K 3.5 3.7 3.6  CL 106 106 106  CO2 25 25 24   GLUCOSE 93 89 100*  BUN 12 18 16   CREATININE 0.59 0.61 0.66  CALCIUM 8.7* 8.8* 8.6*  MG  --  2.2 2.3  PHOS  --  2.4* 2.2*   Recent Labs    06/18/20 0229 06/19/20 0238  AST 43* 34  ALT 7 6  ALKPHOS 63 67  BILITOT 0.3 0.6  PROT 6.1* 6.2*  ALBUMIN 3.0* 3.2*   Recent Labs    06/12/20 1615 06/13/20 0342 06/17/20 0241 06/18/20 0229 06/19/20 0238  WBC 11.3*   < > 6.5 7.8 7.4  NEUTROABS 8.7*  --   --  4.7 4.1  HGB 14.8   < > 12.2 12.7 13.0  HCT 46.6*   < > 38.3 39.7 40.1  MCV 94.3   < > 94.1 93.6 91.8  PLT 452*   < > 339 377 345   < > = values in this interval not displayed.   Lab Results  Component Value Date   TSH 2.377 06/14/2020     Significant Diagnostic Results in last 30 days:  CT Head Wo Contrast  Result Date: 06/12/2020 CLINICAL DATA:  Altered mental status. EXAM: CT HEAD WITHOUT CONTRAST TECHNIQUE: Contiguous axial images were obtained from the base of the skull through the  vertex without intravenous contrast. COMPARISON:  June 01, 2020 FINDINGS: Brain: There is moderate severity cerebral atrophy with widening of the extra-axial spaces and ventricular dilatation. There are areas of decreased attenuation within the white matter tracts of the supratentorial brain, consistent with microvascular disease changes. Vascular: No hyperdense vessel or unexpected calcification. Skull: Normal. Negative for fracture or focal lesion. Sinuses/Orbits: No acute finding. Other: None. IMPRESSION: 1. Generalized cerebral atrophy. 2. No acute intracranial abnormality. Electronically Signed   By: Virgina Norfolk M.D.   On: 06/12/2020 18:02   MR BRAIN WO CONTRAST  Result Date: 06/16/2020 CLINICAL DATA:  86 year old female status post unwitnessed fall on 05/31/2020. Altered mental status. EXAM: MRI HEAD WITHOUT CONTRAST TECHNIQUE: Multiplanar, multiecho pulse sequences of the brain and surrounding structures  were obtained without intravenous contrast. COMPARISON:  Head CT 06/12/2020.  Brain MRI 10/16/2017. FINDINGS: Brain: No restricted diffusion to suggest acute infarction. No midline shift, mass effect, evidence of mass lesion, ventriculomegaly, extra-axial collection or acute intracranial hemorrhage. Cervicomedullary junction and pituitary are within normal limits. Mild for age cerebral volume loss and scattered nonspecific white matter T2/FLAIR hyperintensity has not significantly changed since 2018. Similar mild T2 heterogeneity in the deep gray nuclei. No cortical encephalomalacia or chronic cerebral blood products. Vascular: Major intracranial vascular flow voids are stable since 2018. Skull and upper cervical spine: Negative for age visible cervical spine. Visualized bone marrow signal is within normal limits. Sinuses/Orbits: Stable, negative. Other: Bilateral mastoid effusions are new since 2018. Negative visible pharynx. Other internal auditory structures appear grossly normal. Normal  stylomastoid foramina. Negative scalp and face soft tissues. IMPRESSION: 1. No acute intracranial abnormality. Largely unremarkable for age noncontrast MRI appearance of brain. 2. Bilateral mastoid effusions are new since 2018 and likely postinflammatory. Electronically Signed   By: Genevie Ann M.D.   On: 06/16/2020 16:39   DG Chest Portable 1 View  Result Date: 06/12/2020 CLINICAL DATA:  Altered mental status EXAM: PORTABLE CHEST 1 VIEW COMPARISON:  Radiographs 06/02/2019 FINDINGS: No consolidation, features of edema, pneumothorax, or effusion. The aorta is calcified. The remaining cardiomediastinal contours are unremarkable. No acute osseous or soft tissue abnormality. Stable appearance of several progressive healing left rib fractures. Degenerative changes are present in the imaged spine and shoulders. Telemetry leads overlie the chest. IMPRESSION: No acute cardiopulmonary abnormality. Continued healing of the left rib fractures. Aortic Atherosclerosis (ICD10-I70.0). Electronically Signed   By: Lovena Le M.D.   On: 06/12/2020 16:09    Assessment/Plan  1. Adult failure to thrive - will have hospice care at home - megestrol (MEGACE) 40 MG/ML suspension; Take 1 mL (40 mg total) by mouth daily.  Dispense: 240 mL; Refill: 0  2. Acute cystitis without hematuria - had ceftriaxone and Ancef in the hospital - resolved  3. Parkinson's disease ([place]) - carbidopa-levodopa (SINEMET CR) 50-200 MG tablet; Take 1 tablet by mouth 2 (two) times daily.  Dispense: 60 tablet; Refill: 0  4. History of breast cancer - anastrozole (ARIMIDEX) 1 MG tablet; Take 1 tablet (1 mg total) by mouth daily.  Dispense: 30 tablet; Refill: 0  5. Primary narcolepsy without cataplexy -Continue modafinil 100 mg one orally daily  6. Major depression, recurrent, chronic (HCC) - escitalopram (LEXAPRO) 10 MG tablet; Take 1 tablet (10 mg total) by mouth daily.  Dispense: 30 tablet; Refill: 0  7. Dementia due to Parkinson's disease  without behavioral disturbance ([place]) -Continue rivastigmine 4.6 mg/24-hour patch one patch topically on the skin daily and memantine 5 mg one tab daily      I have filled out patient's discharge paperwork and e-prescribed medications.  Patient will have hospice care at home.  DME provided: None  Total discharge time: Greater than 30 minutes Greater than 50% was spent in counseling and coordination of care.   Discharge time involved coordination of the discharge process with social worker, nursing staff and therapy department.    Durenda Age, DNP, MSN, FNP-BC [facility] [phone] (Monday-Friday 8:00 a.m. - 5:00 p.m.) [phone] (after hours)

## 2020-07-03 MED ORDER — ESCITALOPRAM OXALATE 10 MG PO TABS: 10.0000 mg | ORAL_TABLET | Freq: Every day | ORAL | 0 refills | Status: AC

## 2020-07-03 MED ORDER — MODAFINIL 100 MG PO TABS: 100.0000 mg | ORAL_TABLET | Freq: Every day | ORAL | 0 refills | Status: AC

## 2020-07-03 MED ORDER — ANASTROZOLE 1 MG PO TABS: 1.0000 mg | ORAL_TABLET | Freq: Every day | ORAL | 0 refills | Status: AC

## 2020-07-03 MED ORDER — MEMANTINE HCL 5 MG PO TABS: 5.0000 mg | ORAL_TABLET | Freq: Every day | ORAL | 0 refills | Status: AC

## 2020-07-03 MED ORDER — RIVASTIGMINE 4.6 MG/24HR TD PT24: 4.6000 mg | MEDICATED_PATCH | Freq: Every day | TRANSDERMAL | 0 refills | Status: AC

## 2020-07-03 MED ORDER — SIMVASTATIN 40 MG PO TABS: 40.0000 mg | ORAL_TABLET | Freq: Every day | ORAL | 0 refills | Status: AC

## 2020-07-03 MED ORDER — MEGESTROL ACETATE 40 MG/ML PO SUSP: 40.0000 mg | Freq: Every day | ORAL | 0 refills | Status: AC

## 2020-07-03 MED ORDER — CARBIDOPA-LEVODOPA ER 50-200 MG PO TBCR: 1.0000 | EXTENDED_RELEASE_TABLET | Freq: Two times a day (BID) | ORAL | 0 refills | Status: AC

## 2020-08-14 ENCOUNTER — Other Ambulatory Visit: Payer: Self-pay | Admitting: Adult Health

## 2020-08-14 DIAGNOSIS — F0280 Dementia in other diseases classified elsewhere without behavioral disturbance: Secondary | ICD-10-CM

## 2020-08-15 ENCOUNTER — Other Ambulatory Visit: Payer: Self-pay | Admitting: Adult Health

## 2020-08-15 DIAGNOSIS — G20 Parkinson's disease: Secondary | ICD-10-CM

## 2020-08-15 DIAGNOSIS — F0280 Dementia in other diseases classified elsewhere without behavioral disturbance: Secondary | ICD-10-CM

## 2020-09-17 ENCOUNTER — Telehealth: Payer: Self-pay

## 2020-09-17 NOTE — Telephone Encounter (Signed)
Phone call placed to daughter to make her aware that her message was received and concern is actively being worked on.

## 2020-09-17 NOTE — Telephone Encounter (Signed)
Received message that patient's daughter called requesting assistance to have w/c picked up as patient has passed. Phone call placed to [place]. [employer] to contact daughter and coordinate pick up with her.

## 2021-07-16 IMAGING — CT CT HEAD W/O CM
3 series · 15 of 47 positions shown, 18 images · non-contrast
Comparison: June 01, 2020

CLINICAL DATA: Altered mental status.

EXAM:
CT HEAD WITHOUT CONTRAST
TECHNIQUE: Contiguous axial images were obtained from the base of the skull
through the vertex without intravenous contrast.

[Series 2: head wo · axial · 0.47mm/px · z∈[-142,-2]mm · 9 of 34 slices shown, 12 images]
[im 3/34  brain]
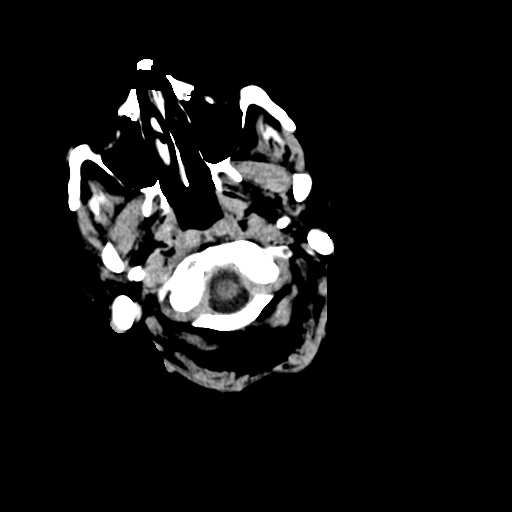
[im 3/34  bone]
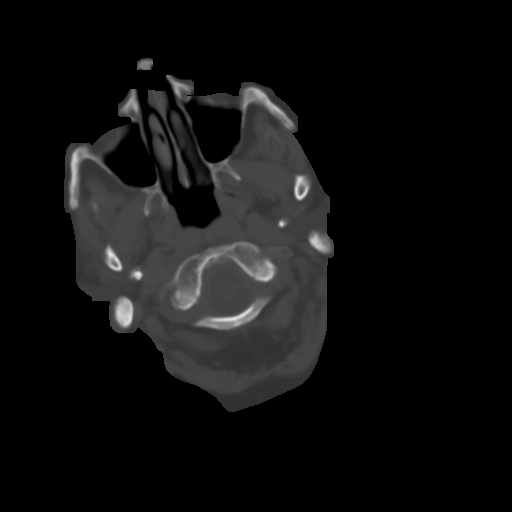
[im 6/34  brain]
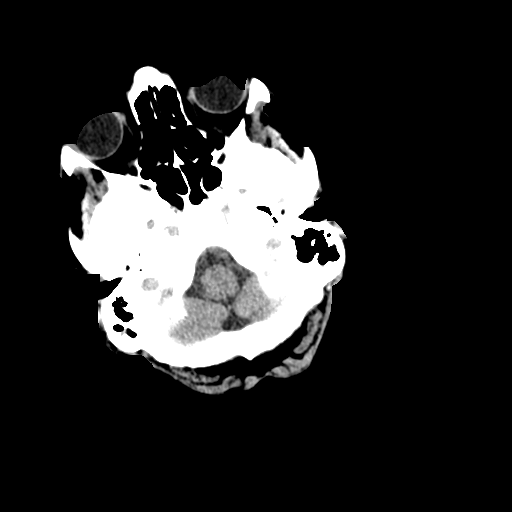
[im 10/34  brain]
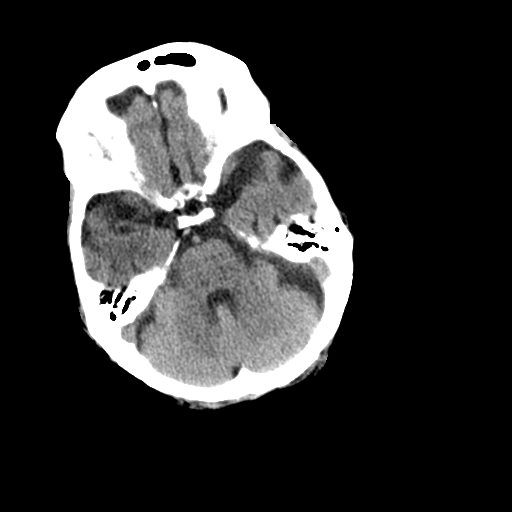
[im 13/34  brain]
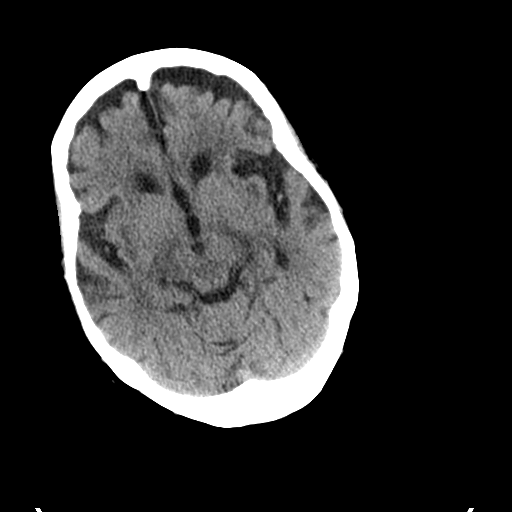
[im 18/34  brain]
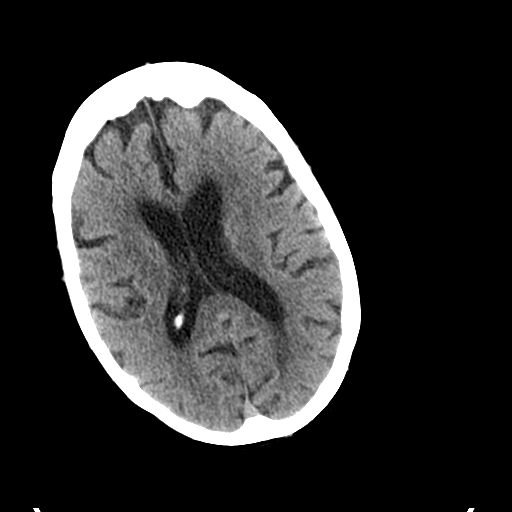
[im 18/34  bone]
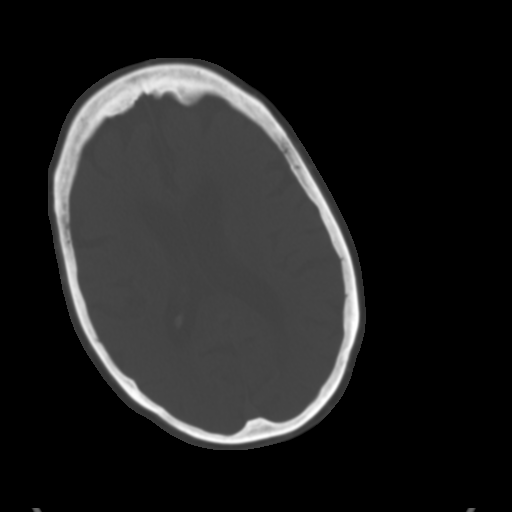
[im 21/34  brain]
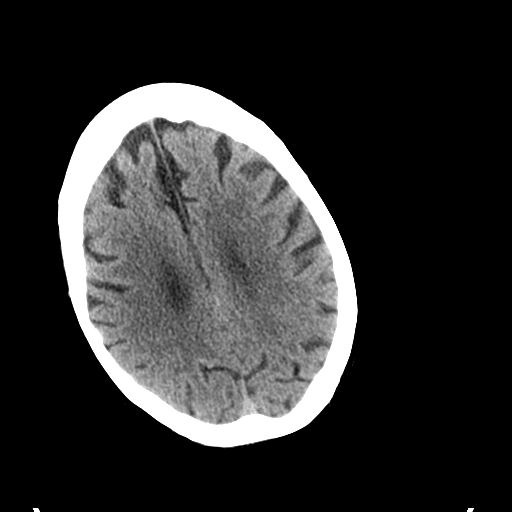
[im 24/34  brain]
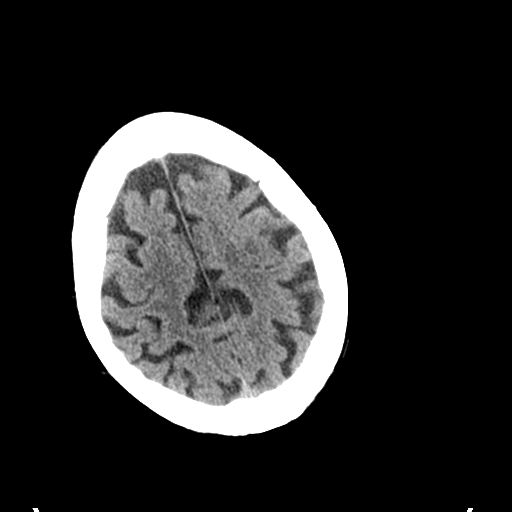
[im 28/34  brain]
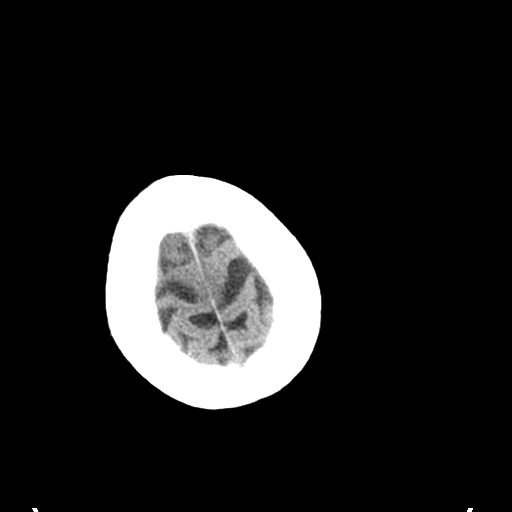
[im 31/34  brain]
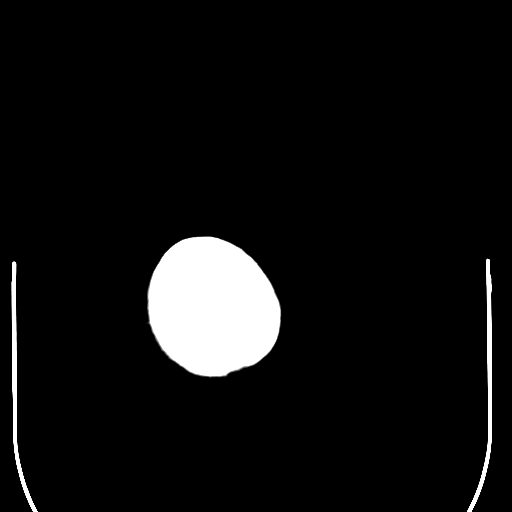
[im 31/34  bone]
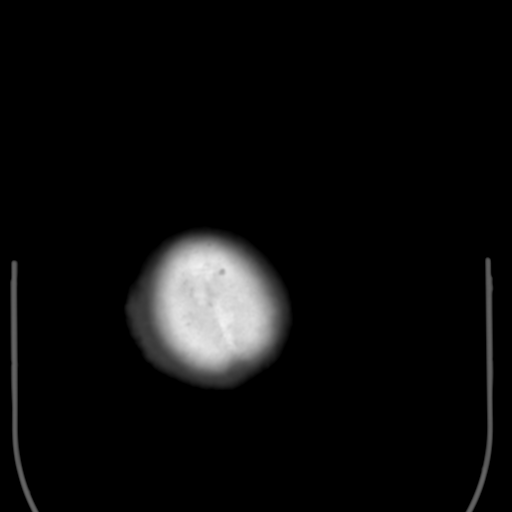

[Series 5: sagittal soft tissue · sagittal · 0.33mm/px · 3 of 51 slices shown]
[im 17/51  brain]
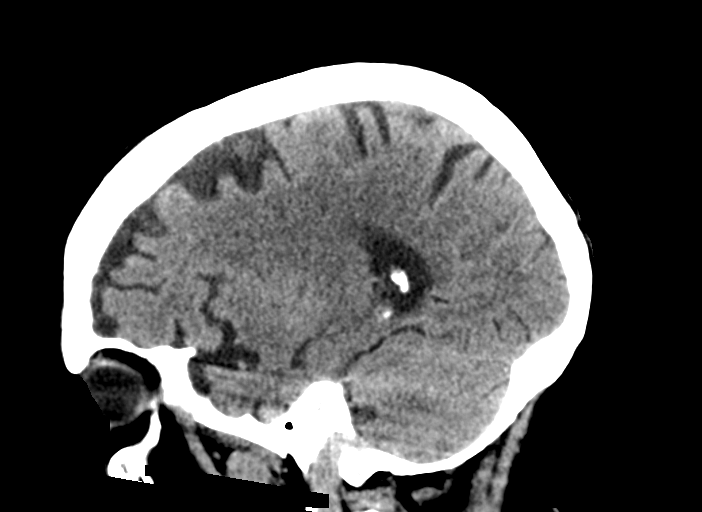
[im 26/51  brain]
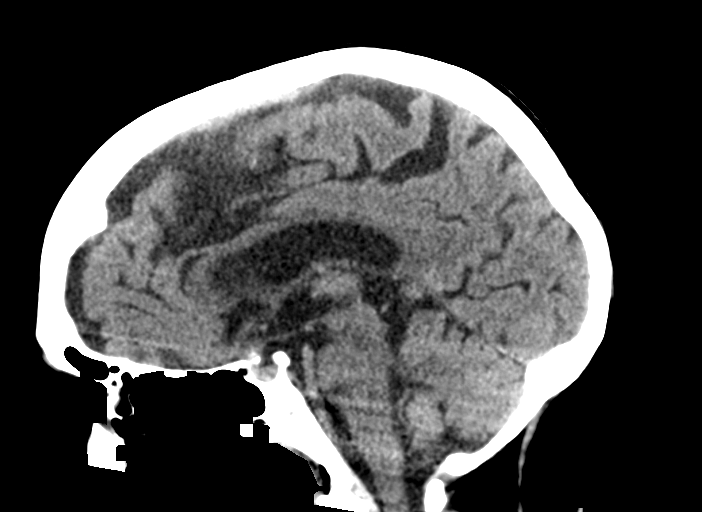
[im 34/51  brain]
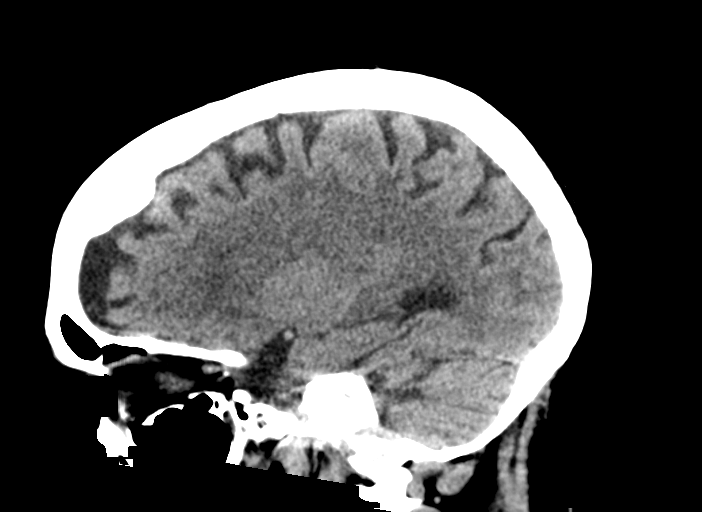

[Series 6: coronal soft tissue · coronal · 0.32mm/px · 3 of 68 slices shown]
[im 23/68  brain]
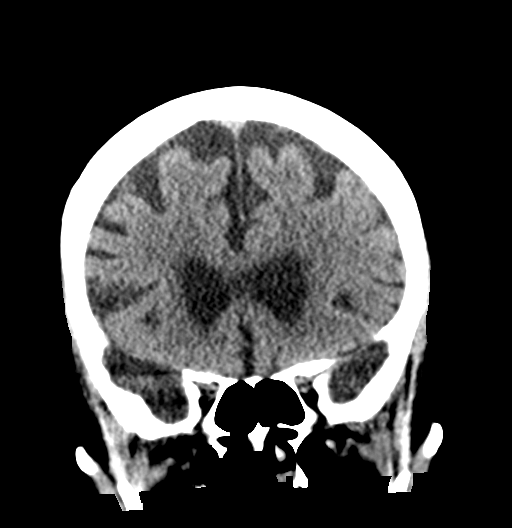
[im 30/68  brain]
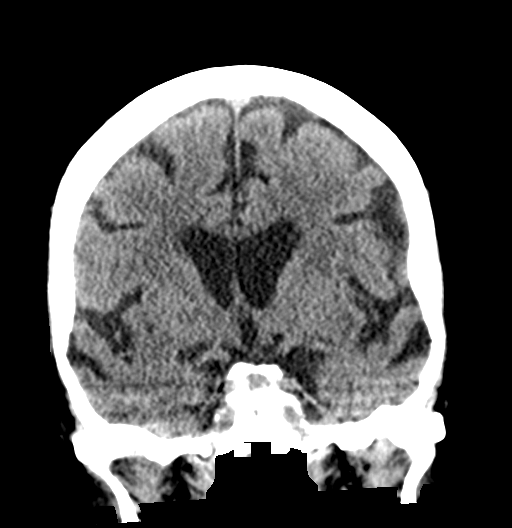
[im 38/68  brain]
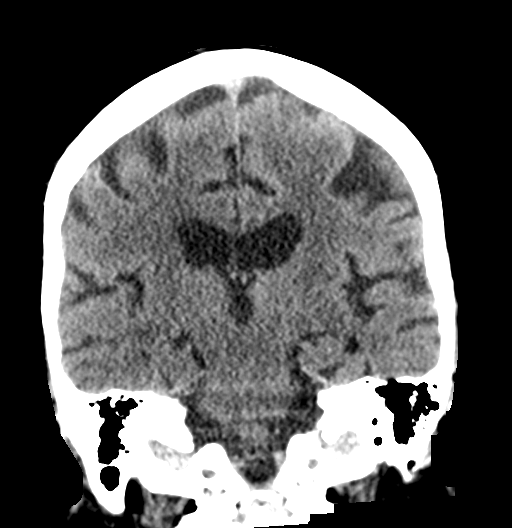

[15 of 47 positions shown; findings below may reference images not displayed]

FINDINGS: Brain: There is moderate severity cerebral atrophy with widening of
the extra-axial spaces and ventricular dilatation.
There are areas of decreased attenuation within the white matter
tracts of the supratentorial brain, consistent with microvascular
disease changes.

Vascular: No hyperdense vessel or unexpected calcification.

Skull: Normal. Negative for fracture or focal lesion.

Sinuses/Orbits: No acute finding.

Other: None.
IMPRESSION: 1. Generalized cerebral atrophy.
2. No acute intracranial abnormality.
